# Patient Record
Sex: Female | Born: 1990 | Race: White | Hispanic: No | Smoking: Former smoker
Health system: Southern US, Academic
[De-identification: ages and names within clinical notes are randomized; demographics above are authoritative.]

## PROBLEM LIST (undated history)

## (undated) DIAGNOSIS — N390 Urinary tract infection, site not specified: Secondary | ICD-10-CM

## (undated) HISTORY — PX: CERVICAL BIOPSY: SHX590

## (undated) HISTORY — PX: HX VAGINAL WALL CYST ASPIRATION: 2100001351

---
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
## 2012-01-23 ENCOUNTER — Emergency Department (HOSPITAL_COMMUNITY)
Admission: EM | Admit: 2012-01-23 | Discharge: 2012-01-23 | Disposition: A | Discharge status: Home / Self Care | Payer: Medicaid Other | Attending: Emergency Medicine | Admitting: Emergency Medicine

## 2012-01-23 ENCOUNTER — Encounter (HOSPITAL_COMMUNITY): Payer: Self-pay | Admitting: *Deleted

## 2012-01-23 DIAGNOSIS — R102 Pelvic and perineal pain: Secondary | ICD-10-CM

## 2012-01-23 DIAGNOSIS — N949 Unspecified condition associated with female genital organs and menstrual cycle: Secondary | ICD-10-CM | POA: Insufficient documentation

## 2012-01-23 DIAGNOSIS — F17200 Nicotine dependence, unspecified, uncomplicated: Secondary | ICD-10-CM | POA: Insufficient documentation

## 2012-01-23 NOTE — ED Notes (Signed)
Any further testing is refused by patient at this time, edp aware

## 2012-01-23 NOTE — ED Notes (Signed)
Patient refuses in and out cath.  °

## 2012-01-23 NOTE — ED Provider Notes (Signed)
History  Scribed for Melissa Givens, MD, [name] Melissa [name] [name] was seen in room APA05/APA05. This chart was scribed by Melissa [name] Melissa [name] [name]. Melissa [name] Melissa [name] [name]'s care started at 1:31 PM    CSN: [id number]  Arrival date & time 01/23/12  1224   First MD Initiated Contact with Melissa [name] [name] 01/23/12 1303      Chief Complaint  Melissa [name] [name] presents with  . Abdominal Pain    Melissa [name] [name] is a 21 y.o. female presenting with abdominal pain. [name] history is provided by Melissa [name] Melissa [name] [name].  Abdominal Pain Melissa [name] primary symptoms of Melissa [name] illness include abdominal pain and nausea. Melissa [name] primary symptoms of Melissa [name] illness do not include vomiting, diarrhea or vaginal discharge. Melissa [name] current episode started 6 to 12 hours ago. Melissa [name] onset of Melissa [name] illness was sudden. Melissa [name] problem has been gradually improving.  Melissa [name] Melissa [name] [name] states that she believes she is currently not pregnant. Melissa [name] Melissa [name] [name] has not had a change in bowel habit. Symptoms associated with [name] illness do not include urgency or frequency.   Melissa [name] Melissa [name] [name] is a 21 y.o. female who presents [name] complaining of diffuse abdominal pain and cramping that started this morning around 7:00am and lasted several hours.  States it felt like she had knots in her abdomen. Pt states that she took motrin with some relief.  She denies vomiting, diarrhea, urgency, or discharge. She does have nausea.  Her LMP was 5/22 and states it was light and only lasted 3 days. Still has mild spotting.  She denies being on BCP's.   Pt here with husband and child, also patients  PCP none     History reviewed. No pertinent past medical history.  Past Surgical History  Procedure Date  . Cervical biopsy     No family history on file.  History  Substance Use Topics  . Smoking status: Current Some Day Smoker  . Smokeless tobacco: Not on file  . Alcohol Use: No  umemployed  OB History    Grav Para Term Preterm Abortions TAB SAB Ect Mult Living                  Review of Systems  Gastrointestinal:  Positive for nausea and abdominal pain. Negative for vomiting and diarrhea.  Genitourinary: Negative for urgency, frequency and vaginal discharge.  All other systems reviewed and are negative.    Allergies  Review of Melissa [name] [name]'s allergies indicates no known allergies.  Home Medications   Current Outpatient Rx  Name Route Sig Dispense Refill  . IBUPROFEN 200 MG PO TABS Oral Take 200 mg by mouth every 6 (six) hours as needed. For pain      BP 108/60  Pulse 82  Temp(Src) 98.7 F (37.1 C) (Oral)  Resp 20  Ht 4\' 11"  (1.499 m)  Wt 106 lb (48.081 kg)  BMI 21.41 kg/m2  SpO2 99%  LMP 01/20/2012  Vital signs normal    Physical Exam  Nursing note and vitals reviewed. Constitutional: She is oriented to person, place, and time. She appears well-developed and well-nourished.  Non-toxic appearance. She does not appear ill. No distress.  HENT:  Head: Normocephalic and atraumatic.  Right Ear: External ear normal.  Left Ear: External ear normal.  Nose: Nose normal. No mucosal edema or rhinorrhea.  Mouth/Throat: Oropharynx is clear and moist and mucous membranes are normal. No dental abscesses or uvula swelling.  Eyes: Conjunctivae and EOM are normal. Pupils are equal, round, and reactive to light.  Neck: Normal range of motion and full passive range of motion  without pain. Neck supple. No tracheal deviation present.  Cardiovascular: Normal rate, regular rhythm and normal heart sounds.  Exam reveals no gallop and no friction rub.   No murmur heard. Pulmonary/Chest: Effort normal and breath sounds normal. No respiratory distress. She has no wheezes. She has no rhonchi. She has no rales. She exhibits no tenderness and no crepitus.  Abdominal: Soft. Normal appearance and bowel sounds are normal. She exhibits no distension. There is Tenderness: suprapubic tenderness.. There is no rebound and no guarding.  Musculoskeletal: Normal range of motion. She exhibits no edema and no tenderness.        Moves all extremities well.   Neurological: She is alert and oriented to person, place, and time. She has normal strength. No cranial nerve deficit or sensory deficit.  Skin: Skin is warm, dry and intact. No rash noted. No erythema. No pallor.  Psychiatric: She has a normal mood and affect. Her speech is normal and behavior is normal. Her mood appears not anxious.    ED Course  Procedures  DIAGNOSTIC STUDIES: Oxygen Saturation is 99% on room air, normal by my interpretation.    COORDINATION OF CARE:  1:43PM Ordered: Urinalysis, Routine w reflex microscopic ; In and Out Cath ; Pelvic cart ; GC/chlamydia probe amp, genital ; Wet prep, genital ; Urinalysis, Routine w reflex microscopic  Pt refused cath urine, then refused pelvic exam, states she will come back if pain persists    Labs Reviewed  URINALYSIS, ROUTINE W REFLEX MICROSCOPIC  GC/CHLAMYDIA PROBE AMP, GENITAL  WET PREP, GENITAL  URINALYSIS, ROUTINE W REFLEX MICROSCOPIC   Labs not done, pt refused exam     1. Pelvic pain     Plan discharge  Devoria Albe, MD, FACEP   MDM   I personally performed [name] services described in this documentation, which was scribed in my presence. [name] recorded information has been reviewed and considered. Devoria Albe, MD, Armando Gang      Melissa Givens, MD 01/23/12 [phone]

## 2012-01-23 NOTE — Discharge Instructions (Signed)
Take the ibuprofen 600 mg 4 times a day for your discomfort. Recheck if your pain seems worse or if you are willing to be examined.

## 2012-01-23 NOTE — ED Notes (Signed)
No pain at present 

## 2012-01-23 NOTE — ED Notes (Signed)
Pt is refusing to have I&o cath want's to leave without any further treatment. [name]

## 2012-01-23 NOTE — ED Notes (Signed)
Generalized, intermittent, sharp pain to abdomen. Nausea at times. Pt states symptoms began this morning. NAD

## 2012-04-29 ENCOUNTER — Encounter (HOSPITAL_BASED_OUTPATIENT_CLINIC_OR_DEPARTMENT_OTHER): Payer: Self-pay | Admitting: *Deleted

## 2012-04-29 ENCOUNTER — Emergency Department (HOSPITAL_BASED_OUTPATIENT_CLINIC_OR_DEPARTMENT_OTHER)
Admission: EM | Admit: 2012-04-29 | Discharge: 2012-04-29 | Disposition: A | Discharge status: Home / Self Care | Payer: Medicaid Other | Attending: Emergency Medicine | Admitting: Emergency Medicine

## 2012-04-29 DIAGNOSIS — N760 Acute vaginitis: Secondary | ICD-10-CM | POA: Insufficient documentation

## 2012-04-29 DIAGNOSIS — A499 Bacterial infection, unspecified: Secondary | ICD-10-CM | POA: Insufficient documentation

## 2012-04-29 DIAGNOSIS — B9689 Other specified bacterial agents as the cause of diseases classified elsewhere: Secondary | ICD-10-CM | POA: Insufficient documentation

## 2012-04-29 DIAGNOSIS — N390 Urinary tract infection, site not specified: Secondary | ICD-10-CM | POA: Insufficient documentation

## 2012-04-29 LAB — URINALYSIS, ROUTINE W REFLEX MICROSCOPIC
Glucose, UA: NEGATIVE mg/dL
Ketones, ur: 15 mg/dL — AB
pH: 5.5 (ref 5.0–8.0)

## 2012-04-29 LAB — WET PREP, GENITAL: Yeast Wet Prep HPF POC: NONE SEEN

## 2012-04-29 LAB — URINE MICROSCOPIC-ADD ON

## 2012-04-29 MED ORDER — CEPHALEXIN 500 MG PO CAPS: 500.0000 mg | ORAL_CAPSULE | Freq: Four times a day (QID) | ORAL | Status: AC

## 2012-04-29 MED ORDER — METRONIDAZOLE 500 MG PO TABS: 500.0000 mg | ORAL_TABLET | Freq: Two times a day (BID) | ORAL | Status: AC
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
## 2012-04-29 NOTE — ED Notes (Signed)
Dysuria x 2 days. Chills.

## 2012-04-30 LAB — GC/CHLAMYDIA PROBE AMP, GENITAL: Chlamydia, DNA Probe: NEGATIVE

## 2012-05-01 LAB — URINE CULTURE
Colony Count: 35000
Special Requests: NORMAL

## 2013-09-13 ENCOUNTER — Encounter (HOSPITAL_BASED_OUTPATIENT_CLINIC_OR_DEPARTMENT_OTHER): Payer: Self-pay | Admitting: Emergency Medicine

## 2013-09-13 ENCOUNTER — Emergency Department (HOSPITAL_BASED_OUTPATIENT_CLINIC_OR_DEPARTMENT_OTHER)
Admission: EM | Admit: 2013-09-13 | Discharge: 2013-09-13 | Disposition: A | Discharge status: Home / Self Care | Payer: Medicaid Other | Attending: Emergency Medicine | Admitting: Emergency Medicine

## 2013-09-13 DIAGNOSIS — N939 Abnormal uterine and vaginal bleeding, unspecified: Secondary | ICD-10-CM

## 2013-09-13 DIAGNOSIS — F17200 Nicotine dependence, unspecified, uncomplicated: Secondary | ICD-10-CM | POA: Insufficient documentation

## 2013-09-13 DIAGNOSIS — N898 Other specified noninflammatory disorders of vagina: Secondary | ICD-10-CM | POA: Insufficient documentation

## 2013-09-13 DIAGNOSIS — Z3202 Encounter for pregnancy test, result negative: Secondary | ICD-10-CM | POA: Insufficient documentation

## 2013-09-13 LAB — URINALYSIS, ROUTINE W REFLEX MICROSCOPIC
Bilirubin Urine: NEGATIVE
Glucose, UA: NEGATIVE mg/dL
Hgb urine dipstick: NEGATIVE
Ketones, ur: NEGATIVE mg/dL
LEUKOCYTES UA: NEGATIVE
NITRITE: NEGATIVE
PH: 7 (ref 5.0–8.0)
Protein, ur: NEGATIVE mg/dL
SPECIFIC GRAVITY, URINE: 1.007 (ref 1.005–1.030)
UROBILINOGEN UA: 0.2 mg/dL (ref 0.0–1.0)

## 2013-09-13 LAB — WET PREP, GENITAL
Trich, Wet Prep: NONE SEEN
Yeast Wet Prep HPF POC: NONE SEEN

## 2013-09-13 LAB — PREGNANCY, URINE: PREG TEST UR: NEGATIVE

## 2013-09-13 NOTE — ED Notes (Signed)
Patient states that her LMP was 12/29 and the patient thinks she had a miscarriage last night. Patient has not had a positive pregnancy test, but last night she states she started bleeding and passed something that "was not a blood clot", but was appx 1/2 inch in size. States a friend told her that she "might have miscarried"

## 2013-09-13 NOTE — ED Provider Notes (Signed)
CSN: [id number]     Arrival date & time 09/13/13  1319 History   First MD Initiated Contact with Patient 09/13/13 1358     Chief Complaint  Patient presents with  . Vaginal Bleeding   (Consider location/radiation/quality/duration/timing/severity/associated sxs/prior Treatment) Patient is a 23 y.o. female presenting with vaginal bleeding. The history is provided by the patient. No language interpreter was used.  Vaginal Bleeding Quality:  Clots Severity:  Moderate Onset quality:  Gradual Timing:  Constant Progression:  Worsening Chronicity:  New Relieved by:  Nothing Worsened by:  Nothing tried Ineffective treatments:  None tried Pt complains of passing a clot last pm.   Pt worried that she had a miscarriage  History reviewed. No pertinent past medical history. Past Surgical History  Procedure Laterality Date  . Cervical biopsy     No family history on file. History  Substance Use Topics  . Smoking status: Current Some Day Smoker  . Smokeless tobacco: Not on file  . Alcohol Use: No   OB History   Grav Para Term Preterm Abortions TAB SAB Ect Mult Living                 Review of Systems  Genitourinary: Positive for vaginal bleeding.  All other systems reviewed and are negative.    Allergies  Review of patient's allergies indicates no known allergies.  Home Medications   Current Outpatient Rx  Name  Route  Sig  Dispense  Refill  . ibuprofen (ADVIL,MOTRIN) 200 MG tablet   Oral   Take 200 mg by mouth every 6 (six) hours as needed. For migraine.         . Pseudoephedrine-APAP-DM (DAYQUIL PO)   Oral   Take 30 mLs by mouth daily as needed. For sore throat.          BP 129/70  Pulse 81  Temp(Src) 98.6 F (37 C) (Oral)  Resp 18  Ht 4\' 11"  (1.499 m)  SpO2 100%  LMP 08/28/2013 Physical Exam  Constitutional: She is oriented to person, place, and time. She appears well-developed and well-nourished.  HENT:  Head: Normocephalic and atraumatic.  Eyes:  Conjunctivae are normal. Pupils are equal, round, and reactive to light.  Neck: Normal range of motion. Neck supple.  Cardiovascular: Normal rate.   Pulmonary/Chest: Effort normal.  Abdominal: Soft. There is no tenderness.  Genitourinary: Vaginal discharge found.  Vaginal discharge,  Thick white,  Adnexa no masses,  Cervix nontender  Musculoskeletal: Normal range of motion.  Neurological: She is alert and oriented to person, place, and time.  Skin: Skin is warm.    ED Course  Procedures (including critical care time) Labs Review Labs Reviewed  URINALYSIS, ROUTINE W REFLEX MICROSCOPIC  PREGNANCY, URINE   Imaging Review No results found.  EKG Interpretation   None       MDM   1. Vaginal bleeding    No bleeding.   [facility]     Elson AreasLeslie K Tyshawna Alarid, [place] JerseyPA-C 09/13/13 2111

## 2013-09-13 NOTE — Discharge Instructions (Signed)
Abnormal Uterine Bleeding Abnormal uterine bleeding means bleeding from the vagina that is not your normal menstrual period. This can be:  Bleeding or spotting between periods.  Bleeding after sex (sexual intercourse).  Bleeding that is heavier or more than normal.  Periods that last longer than usual.  Bleeding after menopause. There are many problems that may cause this. Treatment will depend on the cause of the bleeding. Any kind of bleeding that is not normal should be reviewed by your doctor.  HOME CARE Watch your condition for any changes. These actions may lessen any discomfort you are having:  Do not use tampons or douches as told by your doctor.  Change your pads often. You should get regular pelvic exams and Pap tests. Keep all appointments for tests as told by your doctor. GET HELP IF:  You are bleeding for more than 1 week.  You feel dizzy at times. GET HELP RIGHT AWAY IF:   You pass out.  You have to change pads every 15 to 30 minutes.  You have belly pain.  You have a fever.  You become sweaty or weak.  You are passing large blood clots from the vagina.  You feel sick to your stomach (nauseous) and throw up (vomit). MAKE SURE YOU:  Understand these instructions.  Will watch your condition.  Will get help right away if you are not doing well or get worse. Document Released: 06/14/2009 Document Revised: 06/07/2013 Document Reviewed: 03/16/2013 [employer] Patient Information 2014 [employer].  

## 2013-09-14 LAB — GC/CHLAMYDIA PROBE AMP
CT PROBE, AMP APTIMA: NEGATIVE
GC PROBE AMP APTIMA: NEGATIVE

## 2013-09-14 NOTE — ED Provider Notes (Signed)
Medical screening examination/treatment/procedure(s) were performed by non-physician practitioner and as supervising physician I was immediately available for consultation/collaboration.  EKG Interpretation   None         William Jaray Boliver, MD 09/14/13 2319 

## 2013-11-22 ENCOUNTER — Ambulatory Visit (INDEPENDENT_AMBULATORY_CARE_PROVIDER_SITE_OTHER): Payer: Medicaid Other | Admitting: Adult Health

## 2013-11-22 ENCOUNTER — Encounter: Payer: Self-pay | Admitting: Adult Health

## 2013-11-22 ENCOUNTER — Encounter (INDEPENDENT_AMBULATORY_CARE_PROVIDER_SITE_OTHER): Payer: Self-pay

## 2013-11-22 VITALS — BP 106/60 | Ht <= 58 in | Wt 109.0 lb

## 2013-11-22 DIAGNOSIS — Z3202 Encounter for pregnancy test, result negative: Secondary | ICD-10-CM

## 2013-11-22 LAB — POCT URINE PREGNANCY: Preg Test, Ur: NEGATIVE

## 2013-11-22 NOTE — Progress Notes (Signed)
Patient ID: Melissa [name], female   DOB: [birth year], 23 y.o.   MRN: [id number] Pt here for pregnancy test, resulted negative, LMP 09/28/2013. QHCG done today pt to call office back tomorrow afternoon for results.

## 2013-11-23 ENCOUNTER — Telehealth: Payer: Self-pay | Admitting: Adult Health

## 2013-11-23 ENCOUNTER — Telehealth: Payer: Self-pay | Admitting: *Deleted

## 2013-11-23 LAB — HCG, QUANTITATIVE, PREGNANCY: HCG, BETA CHAIN, QUANT, S: 117.3 m[IU]/mL

## 2013-11-23 NOTE — Telephone Encounter (Signed)
Left message that labs back, number [phone], 2-3 weeks ,call with questions

## 2013-11-24 ENCOUNTER — Other Ambulatory Visit: Payer: Medicaid Other

## 2013-11-24 DIAGNOSIS — Z3202 Encounter for pregnancy test, result negative: Secondary | ICD-10-CM

## 2013-11-24 MED ORDER — PRENATAL PLUS 27-1 MG PO TABS: ORAL_TABLET | ORAL | Status: AC

## 2013-11-24 NOTE — Addendum Note (Signed)
Addended by: Criss AlvinePULLIAM, Loriene Taunton G on: 11/24/2013 12:49 PM   Modules accepted: Orders

## 2013-11-25 ENCOUNTER — Telehealth: Payer: Self-pay | Admitting: Adult Health

## 2013-11-25 LAB — HCG, QUANTITATIVE, PREGNANCY: HCG, BETA CHAIN, QUANT, S: 289.3 m[IU]/mL

## 2013-11-25 NOTE — Telephone Encounter (Signed)
Called pt and left message that numbers more than doubled at [phone],if any questions can call office Monday.

## 2013-11-27 NOTE — Telephone Encounter (Signed)
Jennifer left message for pt

## 2013-11-28 ENCOUNTER — Telehealth: Payer: Self-pay | Admitting: Adult Health

## 2013-11-28 NOTE — Telephone Encounter (Signed)
Pt call transferred to front staff for a New OB appt. Pt has no complaints of cramping or bleeding at this time. Pt to start PNV and call our office back if any problems.

## 2013-12-11 ENCOUNTER — Telehealth: Payer: Self-pay | Admitting: Adult Health

## 2013-12-11 NOTE — Telephone Encounter (Signed)
Pt husband, Maverick, states pt not able to eat much for the past 4 days due to N/V, also pt has an history of HSV. Informed will do blood work if HSV + will be given abx closer to delivery. Will request provider to e-scribe something for N/V. Husband also states pt takes valtrex as a preventive for an HSV outbreak does she need to continue to take. Informed pt to continue to take Valtrex and discuss with provider at next appt.

## 2013-12-12 ENCOUNTER — Other Ambulatory Visit: Payer: Self-pay | Admitting: *Deleted

## 2013-12-12 MED ORDER — DOXYLAMINE-PYRIDOXINE 10-10 MG PO TBEC: 10.0000 mg | DELAYED_RELEASE_TABLET | ORAL | Status: DC

## 2013-12-26 ENCOUNTER — Other Ambulatory Visit: Payer: Self-pay | Admitting: Obstetrics & Gynecology

## 2013-12-26 ENCOUNTER — Other Ambulatory Visit: Payer: Medicaid Other

## 2013-12-26 DIAGNOSIS — O3680X0 Pregnancy with inconclusive fetal viability, not applicable or unspecified: Secondary | ICD-10-CM

## 2013-12-27 ENCOUNTER — Other Ambulatory Visit: Payer: Self-pay | Admitting: Obstetrics & Gynecology

## 2013-12-27 ENCOUNTER — Ambulatory Visit (INDEPENDENT_AMBULATORY_CARE_PROVIDER_SITE_OTHER): Payer: Medicaid Other

## 2013-12-27 DIAGNOSIS — O26849 Uterine size-date discrepancy, unspecified trimester: Secondary | ICD-10-CM

## 2013-12-27 DIAGNOSIS — O3680X0 Pregnancy with inconclusive fetal viability, not applicable or unspecified: Secondary | ICD-10-CM

## 2013-12-27 NOTE — Progress Notes (Signed)
U/S-single IUP with +FCA Noted FHR- 173 bpm, cx appears closed, bilateral adnexa appears wnl, CRL c/w 8+4wks EDD 08/04/2014

## 2014-01-09 ENCOUNTER — Encounter: Payer: Medicaid Other | Admitting: Adult Health

## 2014-01-24 ENCOUNTER — Encounter: Payer: Medicaid Other | Admitting: Women's Health

## 2014-01-31 ENCOUNTER — Ambulatory Visit (INDEPENDENT_AMBULATORY_CARE_PROVIDER_SITE_OTHER): Payer: Medicaid Other | Admitting: Women's Health

## 2014-01-31 ENCOUNTER — Encounter: Payer: Self-pay | Admitting: Women's Health

## 2014-01-31 ENCOUNTER — Other Ambulatory Visit (HOSPITAL_COMMUNITY)
Admission: RE | Admit: 2014-01-31 | Discharge: 2014-01-31 | Disposition: A | Discharge status: Home / Self Care | Payer: Medicaid Other | Source: Ambulatory Visit | Attending: Obstetrics and Gynecology | Admitting: Obstetrics and Gynecology

## 2014-01-31 VITALS — BP 110/60 | Ht 59.0 in | Wt 111.0 lb

## 2014-01-31 DIAGNOSIS — O2341 Unspecified infection of urinary tract in pregnancy, first trimester: Secondary | ICD-10-CM | POA: Insufficient documentation

## 2014-01-31 DIAGNOSIS — O219 Vomiting of pregnancy, unspecified: Secondary | ICD-10-CM

## 2014-01-31 DIAGNOSIS — Z01419 Encounter for gynecological examination (general) (routine) without abnormal findings: Secondary | ICD-10-CM | POA: Insufficient documentation

## 2014-01-31 DIAGNOSIS — O98519 Other viral diseases complicating pregnancy, unspecified trimester: Secondary | ICD-10-CM

## 2014-01-31 DIAGNOSIS — Z331 Pregnant state, incidental: Secondary | ICD-10-CM

## 2014-01-31 DIAGNOSIS — Z1389 Encounter for screening for other disorder: Secondary | ICD-10-CM

## 2014-01-31 DIAGNOSIS — O2390 Unspecified genitourinary tract infection in pregnancy, unspecified trimester: Secondary | ICD-10-CM

## 2014-01-31 DIAGNOSIS — Z3480 Encounter for supervision of other normal pregnancy, unspecified trimester: Secondary | ICD-10-CM

## 2014-01-31 DIAGNOSIS — O210 Mild hyperemesis gravidarum: Secondary | ICD-10-CM

## 2014-01-31 DIAGNOSIS — R768 Other specified abnormal immunological findings in serum: Secondary | ICD-10-CM

## 2014-01-31 LAB — CBC
HCT: 39.4 % (ref 36.0–46.0)
Hemoglobin: 13.3 g/dL (ref 12.0–15.0)
MCH: 31.3 pg (ref 26.0–34.0)
MCHC: 33.8 g/dL (ref 30.0–36.0)
MCV: 92.7 fL (ref 78.0–100.0)
PLATELETS: 234 10*3/uL (ref 150–400)
RBC: 4.25 MIL/uL (ref 3.87–5.11)
RDW: 13.2 % (ref 11.5–15.5)
WBC: 9.7 10*3/uL (ref 4.0–10.5)

## 2014-01-31 LAB — POCT URINALYSIS DIPSTICK
Blood, UA: NEGATIVE
GLUCOSE UA: NEGATIVE
Ketones, UA: NEGATIVE
NITRITE UA: POSITIVE
Protein, UA: NEGATIVE

## 2014-01-31 MED ORDER — ENSURE HEALTHY MOM PO LIQD: ORAL | Status: DC

## 2014-01-31 MED ORDER — NITROFURANTOIN MONOHYD MACRO 100 MG PO CAPS: 100.0000 mg | ORAL_CAPSULE | Freq: Two times a day (BID) | ORAL | Status: DC

## 2014-01-31 MED ORDER — PROMETHAZINE HCL 12.5 MG PO TABS: 12.5000 mg | ORAL_TABLET | Freq: Four times a day (QID) | ORAL | Status: AC | PRN

## 2014-01-31 NOTE — Progress Notes (Addendum)
  Subjective:  Melissa [name] is a 23 y.o. G59P1001 Caucasian female at [redacted]w[redacted]d by 8.4wk u/s, being seen today for her first obstetrical visit.  Her obstetrical history is significant for term uncomplicated svd, smoker 1ppd- quit w/ +PT.  Pregnancy history fully reviewed.  Patient reports n/v, tried diclegis and didn't help, only vomits few times a day now- but not able to keep anything down but cereal and liquids. States she was 90lb before pregnancy- claims she got up to 130lb- and has now lost most of that in the short 13wks she has been pregnant. Requests ensure rx to supplement since she is not able to eat well. Also requests different antiemetic. Denies vb, cramping, uti s/s, abnormal/malodorous vag d/c, or vulvovaginal itching/irritation.  Social History: Sexual: heterosexual Marital Status: married Living situation: with spouse Occupation: unemployed Tobacco/alcohol: quit smoking w/ +PT, was smoking 1ppd, no etoh Illicit drugs: no history of illicit drug use  BP 110/60  Wt 111 lb (50.349 kg)  LMP 08/28/2013  HISTORY: OB History  Gravida Para Term Preterm AB SAB TAB Ectopic Multiple Living  2 1 1       1     # Outcome Date GA Lbr Len/2nd Weight Sex Delivery Anes PTL Lv  2 CUR           1 TRM 09/02/10 [redacted]w[redacted]d  7 lb 10 oz (3.459 kg) M SVD EPI  Y     History reviewed. No pertinent past medical history. Past Surgical History  Procedure Laterality Date  . Cervical biopsy     Family History  Problem Relation Age of Onset  . Urinary tract infection Mother   . Allergies Father   . Cancer Paternal Grandmother   . Asthma Brother     Exam   System:     General: Well developed & nourished, no acute distress   Skin: Warm & dry, normal coloration and turgor, no rashes   Neurologic: Alert & oriented, normal mood   Cardiovascular: Regular rate & rhythm   Respiratory: Effort & rate normal, LCTAB, acyanotic   Abdomen: Soft, non tender   Extremities: normal strength, tone   Pelvic  Exam:    Perineum: Normal perineum   Vulva: Normal, no lesions   Vagina:  Normal mucosa, normal discharge   Cervix: Normal, bulbous, appears closed   Uterus: Normal size/shape/contour for GA   Thin prep pap smear obtained high risk HPV cotesting FHR: 163 via doppler   Assessment:   Pregnancy: G2P1001 Patient Active Problem List   Diagnosis Date Noted  . Supervision of other normal pregnancy 01/31/2014    Priority: High    [redacted]w[redacted]d G2P1001 New OB visit N/V of pregnancy Previous smoker- quit w/ +PT UTI   HSV2+  Plan:  Initial labs drawn Continue prenatal vitamins Problem list reviewed and updated Reviewed n/v relief measures and warning s/s to report Reviewed recommended weight gain based on pre-gravid BMI Encouraged well-balanced diet Genetic Screening discussed Quad Screen: requested Cystic fibrosis screening discussed requested Ultrasound discussed; fetal survey: requested Follow up in 4 weeks for visit and AFP CCNC completed Rx phenergan 12.5mg  q6hr prn n/v, if ever unable to keep food/fluids down- go to hospital Rx macrobid bid x 7d for uti Rx ensure as needed for supplemental calories/nutirition per pt request  Marge Duncans CNM, WHNP-BC 01/31/2014 11:48 AM

## 2014-01-31 NOTE — Patient Instructions (Signed)
Second Trimester of Pregnancy The second trimester is from week 13 through week 28, months 4 through 6. The second trimester is often a time when you feel your best. Your body has also adjusted to being pregnant, and you begin to feel better physically. Usually, morning sickness has lessened or quit completely, you may have more energy, and you may have an increase in appetite. The second trimester is also a time when the fetus is growing rapidly. At the end of the sixth month, the fetus is about 9 inches long and weighs about 1 pounds. You will likely begin to feel the baby move (quickening) between 18 and 20 weeks of the pregnancy. BODY CHANGES Your body goes through many changes during pregnancy. The changes vary from woman to woman.   Your weight will continue to increase. You will notice your lower abdomen bulging out.  You may begin to get stretch marks on your hips, abdomen, and breasts.  You may develop headaches that can be relieved by medicines approved by your caregiver.  You may urinate more often because the fetus is pressing on your bladder.  You may develop or continue to have heartburn as a result of your pregnancy.  You may develop constipation because certain hormones are causing the muscles that push waste through your intestines to slow down.  You may develop hemorrhoids or swollen, bulging veins (varicose veins).  You may have back pain because of the weight gain and pregnancy hormones relaxing your joints between the bones in your pelvis and as a result of a shift in weight and the muscles that support your balance.  Your breasts will continue to grow and be tender.  Your gums may bleed and may be sensitive to brushing and flossing.  Dark spots or blotches (chloasma, mask of pregnancy) may develop on your face. This will likely fade after the baby is born.  A dark line from your belly button to the pubic area (linea nigra) may appear. This will likely fade after the  baby is born. WHAT TO EXPECT AT YOUR PRENATAL VISITS During a routine prenatal visit:  You will be weighed to make sure you and the fetus are growing normally.  Your blood pressure will be taken.  Your abdomen will be measured to track your baby's growth.  The fetal heartbeat will be listened to.  Any test results from the previous visit will be discussed. Your caregiver may ask you:  How you are feeling.  If you are feeling the baby move.  If you have had any abnormal symptoms, such as leaking fluid, bleeding, severe headaches, or abdominal cramping.  If you have any questions. Other tests that may be performed during your second trimester include:  Blood tests that check for:  Low iron levels (anemia).  Gestational diabetes (between 24 and 28 weeks).  Rh antibodies.  Urine tests to check for infections, diabetes, or protein in the urine.  An ultrasound to confirm the proper growth and development of the baby.  An amniocentesis to check for possible genetic problems.  Fetal screens for spina bifida and Down syndrome. HOME CARE INSTRUCTIONS   Avoid all smoking, herbs, alcohol, and unprescribed drugs. These chemicals affect the formation and growth of the baby.  Follow your caregiver's instructions regarding medicine use. There are medicines that are either safe or unsafe to take during pregnancy.  Exercise only as directed by your caregiver. Experiencing uterine cramps is a good sign to stop exercising.  Continue to eat regular,   healthy meals.  Wear a good support bra for breast tenderness.  Do not use hot tubs, steam rooms, or saunas.  Wear your seat belt at all times when driving.  Avoid raw meat, uncooked cheese, cat litter boxes, and soil used by cats. These carry germs that can cause birth defects in the baby.  Take your prenatal vitamins.  Try taking a stool softener (if your caregiver approves) if you develop constipation. Eat more high-fiber foods,  such as fresh vegetables or fruit and whole grains. Drink plenty of fluids to keep your urine clear or pale yellow.  Take warm sitz baths to soothe any pain or discomfort caused by hemorrhoids. Use hemorrhoid cream if your caregiver approves.  If you develop varicose veins, wear support hose. Elevate your feet for 15 minutes, 3 4 times a day. Limit salt in your diet.  Avoid heavy lifting, wear low heel shoes, and practice good posture.  Rest with your legs elevated if you have leg cramps or low back pain.  Visit your dentist if you have not gone yet during your pregnancy. Use a soft toothbrush to brush your teeth and be gentle when you floss.  A sexual relationship may be continued unless your caregiver directs you otherwise.  Continue to go to all your prenatal visits as directed by your caregiver. SEEK MEDICAL CARE IF:   You have dizziness.  You have mild pelvic cramps, pelvic pressure, or nagging pain in the abdominal area.  You have persistent nausea, vomiting, or diarrhea.  You have a bad smelling vaginal discharge.  You have pain with urination. SEEK IMMEDIATE MEDICAL CARE IF:   You have a fever.  You are leaking fluid from your vagina.  You have spotting or bleeding from your vagina.  You have severe abdominal cramping or pain.  You have rapid weight gain or loss.  You have shortness of breath with chest pain.  You notice sudden or extreme swelling of your face, hands, ankles, feet, or legs.  You have not felt your baby move in over an hour.  You have severe headaches that do not go away with medicine.  You have vision changes. Document Released: 08/11/2001 Document Revised: 04/19/2013 Document Reviewed: 10/18/2012 [facility] Patient Information 2014 [place], [place].  Pregnancy and Urinary Tract Infection A urinary tract infection (UTI) is a bacterial infection of the urinary tract. Infection of the urinary tract can include the ureters, kidneys  (pyelonephritis), bladder (cystitis), and urethra (urethritis). All pregnant women should be screened for bacteria in the urinary tract. Identifying and treating a UTI will decrease the risk of preterm labor and developing more serious infections in both the mother and baby. CAUSES Bacteria germs cause almost all UTIs.  RISK FACTORS Many factors can increase your chances of getting a UTI during pregnancy. These include:  Having a short urethra.  Poor toilet and hygiene habits.  Sexual intercourse.  Blockage of urine along the urinary tract.  Problems with the pelvic muscles or nerves.  Diabetes.  Obesity.  Bladder problems after having several children.  Previous history of UTI. SIGNS AND SYMPTOMS   Pain, burning, or a stinging feeling when urinating.  Suddenly feeling the need to urinate right away (urgency).  Loss of bladder control (urinary incontinence).  Frequent urination, more than is common with pregnancy.  Lower abdominal or back discomfort.  Cloudy urine.  Blood in the urine (hematuria).  Fever. When the kidneys are infected, the symptoms may be:  Back pain.  Flank pain on the  right side more so than the left.  Fever.  Chills.  Nausea.  Vomiting. DIAGNOSIS  A urinary tract infection is usually diagnosed through urine tests. Additional tests and procedures are sometimes done. These may include:  Ultrasound exam of the kidneys, ureters, bladder, and urethra.  Looking in the bladder with a lighted tube (cystoscopy). TREATMENT Typically, UTIs can be treated with antibiotic medicines.  HOME CARE INSTRUCTIONS   Only take over-the-counter or prescription medicines as directed by your health care provider. If you were prescribed antibiotics, take them as directed. Finish them even if you start to feel better.  Drink enough fluids to keep your urine clear or pale yellow.  Do not have sexual intercourse until the infection is gone and your health  care provider says it is okay.  Make sure you are tested for UTIs throughout your pregnancy. These infections often come back. Preventing a UTI in the Future  Practice good toilet habits. Always wipe from front to back. Use the tissue only once.  Do not hold your urine. Empty your bladder as soon as possible when the urge comes.  Do not douche or use deodorant sprays.  Wash with soap and warm water around the genital area and the anus.  Empty your bladder before and after sexual intercourse.  Wear underwear with a cotton crotch.  Avoid caffeine and carbonated drinks. They can irritate the bladder.  Drink cranberry juice or take cranberry pills. This may decrease the risk of getting a UTI.  Do not drink alcohol.  Keep all your appointments and tests as scheduled. SEEK MEDICAL CARE IF:   Your symptoms get worse.  You are still having fevers 2 or more days after treatment begins.  You have a rash.  You feel that you are having problems with medicines prescribed.  You have abnormal vaginal discharge. SEEK IMMEDIATE MEDICAL CARE IF:   You have back or flank pain.  You have chills.  You have blood in your urine.  You have nausea and vomiting.  You have contractions of your uterus.  You have a gush of fluid from the vagina. MAKE SURE YOU:  Understand these instructions.   Will watch your condition.   Will get help right away if you are not doing well or get worse.  Document Released: 12/12/2010 Document Revised: 06/07/2013 Document Reviewed: 03/16/2013 [facility] Patient Information 2014 [place], [place].

## 2014-01-31 NOTE — Addendum Note (Signed)
Addended by: Colen Darling on: 01/31/2014 02:12 PM   Modules accepted: Orders

## 2014-02-01 LAB — CYSTIC FIBROSIS DIAGNOSTIC STUDY

## 2014-02-01 LAB — DRUG SCREEN, URINE, NO CONFIRMATION
AMPHETAMINE SCRN UR: NEGATIVE
BARBITURATE QUANT UR: NEGATIVE
Benzodiazepines.: NEGATIVE
Cocaine Metabolites: NEGATIVE
Creatinine,U: 189.1 mg/dL
METHADONE: NEGATIVE
Marijuana Metabolite: POSITIVE — AB
Opiate Screen, Urine: NEGATIVE
Phencyclidine (PCP): NEGATIVE
Propoxyphene: NEGATIVE

## 2014-02-01 LAB — URINALYSIS
BILIRUBIN URINE: NEGATIVE
Glucose, UA: NEGATIVE mg/dL
Hgb urine dipstick: NEGATIVE
Ketones, ur: NEGATIVE mg/dL
Nitrite: POSITIVE — AB
PROTEIN: NEGATIVE mg/dL
Specific Gravity, Urine: 1.02 (ref 1.005–1.030)
Urobilinogen, UA: 0.2 mg/dL (ref 0.0–1.0)
pH: 6 (ref 5.0–8.0)

## 2014-02-01 LAB — OXYCODONE SCREEN, UA, RFLX CONFIRM: OXYCODONE SCRN UR: NEGATIVE ng/mL

## 2014-02-01 LAB — GC/CHLAMYDIA PROBE AMP
CT Probe RNA: NEGATIVE
GC PROBE AMP APTIMA: NEGATIVE

## 2014-02-01 LAB — RPR

## 2014-02-01 LAB — HIV ANTIBODY (ROUTINE TESTING W REFLEX): HIV: NONREACTIVE

## 2014-02-01 LAB — VARICELLA ZOSTER ANTIBODY, IGG: Varicella IgG: 531.8 Index — ABNORMAL HIGH (ref <135.00)

## 2014-02-01 LAB — ANTIBODY SCREEN: ANTIBODY SCREEN: NEGATIVE

## 2014-02-01 LAB — RUBELLA SCREEN: RUBELLA: 7.55 {index} — AB (ref <0.90)

## 2014-02-01 LAB — ABO AND RH: Rh Type: POSITIVE

## 2014-02-01 LAB — HEPATITIS B SURFACE ANTIGEN: Hepatitis B Surface Ag: NEGATIVE

## 2014-02-03 LAB — URINE CULTURE: Colony Count: >=100000

## 2014-02-05 ENCOUNTER — Encounter: Payer: Self-pay | Admitting: Women's Health

## 2014-02-05 DIAGNOSIS — F1290 Cannabis use, unspecified, uncomplicated: Secondary | ICD-10-CM | POA: Insufficient documentation

## 2014-02-05 LAB — CYTOLOGY - PAP

## 2014-02-07 ENCOUNTER — Encounter: Payer: Self-pay | Admitting: Women's Health

## 2014-02-16 ENCOUNTER — Telehealth: Payer: Self-pay | Admitting: Obstetrics and Gynecology

## 2014-02-16 NOTE — Telephone Encounter (Signed)
Pt states saw on Mychart  urine culture was positive for Ecoli. Per Joellyn HaffKim Booker, CNM note, pt will get RX for Macrobid at next appt. Pt verbalized understanding.

## 2014-02-28 ENCOUNTER — Encounter: Payer: Medicaid Other | Admitting: Adult Health

## 2014-02-28 ENCOUNTER — Other Ambulatory Visit: Payer: Medicaid Other

## 2014-03-01 ENCOUNTER — Other Ambulatory Visit: Payer: Self-pay | Admitting: Obstetrics and Gynecology

## 2014-03-01 DIAGNOSIS — F1920 Other psychoactive substance dependence, uncomplicated: Secondary | ICD-10-CM

## 2014-03-01 DIAGNOSIS — O99320 Drug use complicating pregnancy, unspecified trimester: Principal | ICD-10-CM

## 2014-03-06 ENCOUNTER — Ambulatory Visit (INDEPENDENT_AMBULATORY_CARE_PROVIDER_SITE_OTHER): Payer: Medicaid Other | Admitting: Advanced Practice Midwife

## 2014-03-06 ENCOUNTER — Encounter: Payer: Self-pay | Admitting: Advanced Practice Midwife

## 2014-03-06 ENCOUNTER — Ambulatory Visit (INDEPENDENT_AMBULATORY_CARE_PROVIDER_SITE_OTHER): Payer: Medicaid Other

## 2014-03-06 VITALS — BP 100/52 | Wt 116.0 lb

## 2014-03-06 DIAGNOSIS — Z331 Pregnant state, incidental: Secondary | ICD-10-CM

## 2014-03-06 DIAGNOSIS — R8290 Unspecified abnormal findings in urine: Secondary | ICD-10-CM

## 2014-03-06 DIAGNOSIS — O2341 Unspecified infection of urinary tract in pregnancy, first trimester: Secondary | ICD-10-CM

## 2014-03-06 DIAGNOSIS — O99320 Drug use complicating pregnancy, unspecified trimester: Principal | ICD-10-CM

## 2014-03-06 DIAGNOSIS — F1920 Other psychoactive substance dependence, uncomplicated: Secondary | ICD-10-CM

## 2014-03-06 DIAGNOSIS — Z3482 Encounter for supervision of other normal pregnancy, second trimester: Secondary | ICD-10-CM

## 2014-03-06 DIAGNOSIS — Z1389 Encounter for screening for other disorder: Secondary | ICD-10-CM

## 2014-03-06 DIAGNOSIS — Z3480 Encounter for supervision of other normal pregnancy, unspecified trimester: Secondary | ICD-10-CM

## 2014-03-06 LAB — POCT URINALYSIS DIPSTICK
Blood, UA: NEGATIVE
GLUCOSE UA: NEGATIVE
Ketones, UA: NEGATIVE
Protein, UA: NEGATIVE

## 2014-03-06 NOTE — Progress Notes (Signed)
[id number] [id number] Estimated Date of Delivery: 08/04/14  Blood pressure 100/52, weight 116 lb (52.617 kg), last menstrual period 08/28/2013.   BP weight and urine results all reviewed and noted.  Please refer to the obstetrical flow sheet for the fundal height and fetal heart rate documentation:  Had anatomy scan today, all normal.  Patient reports good fetal movement, denies any bleeding and no rupture of membranes symptoms or regular contractions. Patient is without complaints. All questions were answered.  Plan:  Continued routine obstetrical care, culture urine (POC UTI).  AFP today Follow up in 4 weeks for OB appointment,

## 2014-03-06 NOTE — Progress Notes (Signed)
U/S(18+3wks)-active fetus, meas c/w dates, fluid wnl, anterior Gr 0 placenta, cx appears closed (3.6cm), bilateral adnexa appears WNL, FHR-147 bpm, female fetus, no major abnl noted

## 2014-03-07 LAB — AFP, QUAD SCREEN
AFP: 51.7 IU/mL
Age Alone: 1:1140 {titer}
Curr Gest Age: 18.3 wks.days
Down Syndrome Scr Risk Est: 1:9200 {titer}
HCG TOTAL: 18368 m[IU]/mL
INH: 342.1 pg/mL
INTERPRETATION-AFP: NEGATIVE
MOM FOR AFP: 1.08
MOM FOR HCG: 0.92
MoM for INH: 1.47
OPEN SPINA BIFIDA: NEGATIVE
Tri 18 Scr Risk Est: NEGATIVE
UE3 MOM: 0.93
UE3 VALUE: 0.9 ng/mL

## 2014-03-09 LAB — URINE CULTURE

## 2014-03-13 ENCOUNTER — Encounter: Payer: Self-pay | Admitting: Advanced Practice Midwife

## 2014-03-13 MED ORDER — AMOXICILLIN-POT CLAVULANATE 500-125 MG PO TABS: 1.0000 | ORAL_TABLET | Freq: Two times a day (BID) | ORAL | Status: DC

## 2014-03-13 NOTE — Addendum Note (Signed)
Addended by: Jacklyn ShellRESENZO-DISHMON, Karmela Bram on: 03/13/2014 12:02 PM   Modules accepted: Orders

## 2014-04-03 ENCOUNTER — Encounter: Payer: Medicaid Other | Admitting: Advanced Practice Midwife

## 2014-06-15 ENCOUNTER — Other Ambulatory Visit: Payer: Self-pay

## 2014-07-02 ENCOUNTER — Encounter: Payer: Self-pay | Admitting: Advanced Practice Midwife

## 2014-09-16 ENCOUNTER — Encounter (HOSPITAL_BASED_OUTPATIENT_CLINIC_OR_DEPARTMENT_OTHER): Payer: Self-pay | Admitting: *Deleted

## 2014-09-16 ENCOUNTER — Emergency Department (HOSPITAL_BASED_OUTPATIENT_CLINIC_OR_DEPARTMENT_OTHER)
Admission: EM | Admit: 2014-09-16 | Discharge: 2014-09-16 | Disposition: A | Discharge status: Home / Self Care | Payer: Medicaid Other | Attending: Emergency Medicine | Admitting: Emergency Medicine

## 2014-09-16 DIAGNOSIS — Z720 Tobacco use: Secondary | ICD-10-CM | POA: Insufficient documentation

## 2014-09-16 DIAGNOSIS — Z792 Long term (current) use of antibiotics: Secondary | ICD-10-CM | POA: Diagnosis not present

## 2014-09-16 DIAGNOSIS — Z79899 Other long term (current) drug therapy: Secondary | ICD-10-CM | POA: Diagnosis not present

## 2014-09-16 DIAGNOSIS — H02846 Edema of left eye, unspecified eyelid: Secondary | ICD-10-CM | POA: Insufficient documentation

## 2014-09-16 DIAGNOSIS — H5713 Ocular pain, bilateral: Secondary | ICD-10-CM | POA: Diagnosis present

## 2014-09-16 DIAGNOSIS — H02843 Edema of right eye, unspecified eyelid: Secondary | ICD-10-CM | POA: Diagnosis not present

## 2014-09-16 NOTE — ED Notes (Signed)
Periorbital eye swelling x 1 day

## 2014-09-16 NOTE — Discharge Instructions (Signed)
Take Benadryl every 6 hours as needed for itching. Apply cool compresses to your eyes, intermittently with warm. Allergies Allergies may happen from anything your body is sensitive to. This may be food, medicines, pollens, chemicals, and nearly anything around you in everyday life that produces allergens. An allergen is anything that causes an allergy producing substance. Heredity is often a factor in causing these problems. This means you may have some of the same allergies as your parents. Food allergies happen in all age groups. Food allergies are some of the most severe and life threatening. Some common food allergies are cow's milk, seafood, eggs, nuts, wheat, and soybeans. SYMPTOMS   Swelling around the mouth.  An itchy red rash or hives.  Vomiting or diarrhea.  Difficulty breathing. SEVERE ALLERGIC REACTIONS ARE LIFE-THREATENING. This reaction is called anaphylaxis. It can cause the mouth and throat to swell and cause difficulty with breathing and swallowing. In severe reactions only a trace amount of food (for example, peanut oil in a salad) may cause death within seconds. Seasonal allergies occur in all age groups. These are seasonal because they usually occur during the same season every year. They may be a reaction to molds, grass pollens, or tree pollens. Other causes of problems are house dust mite allergens, pet dander, and mold spores. The symptoms often consist of nasal congestion, a runny itchy nose associated with sneezing, and tearing itchy eyes. There is often an associated itching of the mouth and ears. The problems happen when you come in contact with pollens and other allergens. Allergens are the particles in the air that the body reacts to with an allergic reaction. This causes you to release allergic antibodies. Through a chain of events, these eventually cause you to release histamine into the blood stream. Although it is meant to be protective to the body, it is this release  that causes your discomfort. This is why you were given anti-histamines to feel better. If you are unable to pinpoint the offending allergen, it may be determined by skin or blood testing. Allergies cannot be cured but can be controlled with medicine. Hay fever is a collection of all or some of the seasonal allergy problems. It may often be treated with simple over-the-counter medicine such as diphenhydramine. Take medicine as directed. Do not drink alcohol or drive while taking this medicine. Check with your caregiver or package insert for child dosages. If these medicines are not effective, there are many new medicines your caregiver can prescribe. Stronger medicine such as nasal spray, eye drops, and corticosteroids may be used if the first things you try do not work well. Other treatments such as immunotherapy or desensitizing injections can be used if all else fails. Follow up with your caregiver if problems continue. These seasonal allergies are usually not life threatening. They are generally more of a nuisance that can often be handled using medicine. HOME CARE INSTRUCTIONS   If unsure what causes a reaction, keep a diary of foods eaten and symptoms that follow. Avoid foods that cause reactions.  If hives or rash are present:  Take medicine as directed.  You may use an over-the-counter antihistamine (diphenhydramine) for hives and itching as needed.  Apply cold compresses (cloths) to the skin or take baths in cool water. Avoid hot baths or showers. Heat will make a rash and itching worse.  If you are severely allergic:  Following a treatment for a severe reaction, hospitalization is often required for closer follow-up.  Wear a medic-alert bracelet  or necklace stating the allergy.  You and your family must learn how to give adrenaline or use an anaphylaxis kit.  If you have had a severe reaction, always carry your anaphylaxis kit or EpiPen with you. Use this medicine as directed by  your caregiver if a severe reaction is occurring. Failure to do so could have a fatal outcome. SEEK MEDICAL CARE IF:  You suspect a food allergy. Symptoms generally happen within 30 minutes of eating a food.  Your symptoms have not gone away within 2 days or are getting worse.  You develop new symptoms.  You want to retest yourself or your child with a food or drink you think causes an allergic reaction. Never do this if an anaphylactic reaction to that food or drink has happened before. Only do this under the care of a caregiver. SEEK IMMEDIATE MEDICAL CARE IF:   You have difficulty breathing, are wheezing, or have a tight feeling in your chest or throat.  You have a swollen mouth, or you have hives, swelling, or itching all over your body.  You have had a severe reaction that has responded to your anaphylaxis kit or an EpiPen. These reactions may return when the medicine has worn off. These reactions should be considered life threatening. MAKE SURE YOU:   Understand these instructions.  Will watch your condition.  Will get help right away if you are not doing well or get worse. Document Released: 11/10/2002 Document Revised: 12/12/2012 Document Reviewed: 04/16/2008 [facility] Patient Information 2015 [place], [place]. This information is not intended to replace advice given to you by your health care provider. Make sure you discuss any questions you have with your health care provider.

## 2014-09-16 NOTE — ED Provider Notes (Signed)
CSN: [id number]     Arrival date & time 09/16/14  1427 History   First MD Initiated Contact with Patient 09/16/14 1429     Chief Complaint  Patient presents with  . Eye Pain     (Consider location/radiation/quality/duration/timing/severity/associated sxs/prior Treatment) HPI Comments: 24 y/o female presenting with eyelid swelling x 2 days. Pt reports 2 nights ago her left eyelid started to swell and become itchy, and today her right eyelid started swelling. Denies eye pain or blurred vision, just states the swelling is blocking part of her eye. Denies fevers. No new soaps, detergents, lotions or makeup. States symptoms began when she went to a family members house where she needed to stay the night and it was very dirty. She has not tried any alleviating factors for her symptoms.  Patient is a 24 y.o. female presenting with eye pain. The history is provided by the patient.  Eye Pain    History reviewed. No pertinent past medical history. Past Surgical History  Procedure Laterality Date  . Cervical biopsy     Family History  Problem Relation Age of Onset  . Urinary tract infection Mother   . Allergies Father   . Cancer Paternal Grandmother   . Asthma Brother    History  Substance Use Topics  . Smoking status: Current Every Day Smoker    Types: Cigarettes    Last Attempt to Quit: 11/19/2013  . Smokeless tobacco: Never Used  . Alcohol Use: No   OB History    Gravida Para Term Preterm AB TAB SAB Ectopic Multiple Living   2 1 1       1      Review of Systems  10 Systems reviewed and are negative for acute change except as noted in the HPI.  Allergies  Review of patient's allergies indicates no known allergies.  Home Medications   Prior to Admission medications   Medication Sig Start Date End Date Taking? Authorizing Provider  amoxicillin-clavulanate (AUGMENTIN) 500-125 MG per tablet Take 1 tablet (500 mg total) by mouth 2 (two) times daily. 03/13/14   Jacklyn ShellFrances  Cresenzo-Dishmon, CNM  Doxylamine-Pyridoxine (DICLEGIS) 10-10 MG TBEC Take 10 mg by mouth See admin instructions. 12/12/13   Marge DuncansKimberly Randall Booker, CNM  Nutritional Supplements (ENSURE HEALTHY MOM) LIQD 1-3 cans daily if not able to keep a lot of foods down 01/31/14   Marge DuncansKimberly Randall Booker, CNM  prenatal vitamin w/FE, FA (PRENATAL 1 + 1) 27-1 MG TABS tablet 1 tablet daily 11/24/13   Adline PotterJennifer A Griffin, NP  promethazine (PHENERGAN) 12.5 MG tablet Take 1 tablet (12.5 mg total) by mouth every 6 (six) hours as needed for nausea or vomiting. 01/31/14   Marge DuncansKimberly Randall Booker, CNM   BP 119/60 mmHg  Pulse 78  Temp(Src) 97.8 F (36.6 C) (Oral)  Resp 16  SpO2 95%  LMP 09/02/2014  Breastfeeding? No Physical Exam  Constitutional: She is oriented to person, place, and time. She appears well-developed and well-nourished. No distress.  HENT:  Head: Normocephalic and atraumatic.  Mouth/Throat: Oropharynx is clear and moist.  Eyes: Conjunctivae and EOM are normal. Pupils are equal, round, and reactive to light. Right eye exhibits no discharge. Left eye exhibits no discharge.  Bilateral eyelid swelling, R>L. No erythema or warmth. No hordeolum. No FB.  Neck: Normal range of motion. Neck supple.  Cardiovascular: Normal rate, regular rhythm and normal heart sounds.   Pulmonary/Chest: Effort normal and breath sounds normal. No respiratory distress.  Musculoskeletal: Normal range of motion. She exhibits  no edema.  Neurological: She is alert and oriented to person, place, and time. No sensory deficit.  Skin: Skin is warm and dry.  Psychiatric: She has a normal mood and affect. Her behavior is normal.  Nursing note and vitals reviewed.   ED Course  Procedures (including critical care time) Labs Review Labs Reviewed - No data to display  Imaging Review No results found.   EKG Interpretation None      MDM   Final diagnoses:  Eyelid edema, right  Eyelid edema, left   Pt in NAD. AFVSS. No  erythema or warmth concerning for cellulitis. Most likely allergic reaction to the house she went to. No eye involvement. Advised cool compresses and benadryl. She is no longer going to stay at that home. Stable for d/c. Return precautions given. Patient states understanding of treatment care plan and is agreeable.  Kathrynn Speed, PA-C 09/16/14 1507  Linwood Dibbles, MD 09/19/14 [phone]

## 2016-10-31 ENCOUNTER — Encounter (HOSPITAL_BASED_OUTPATIENT_CLINIC_OR_DEPARTMENT_OTHER): Payer: Self-pay | Admitting: Emergency Medicine

## 2016-10-31 ENCOUNTER — Emergency Department (HOSPITAL_BASED_OUTPATIENT_CLINIC_OR_DEPARTMENT_OTHER)
Admission: EM | Admit: 2016-10-31 | Discharge: 2016-11-01 | Disposition: A | Discharge status: Home / Self Care | Payer: Medicaid Other | Attending: Emergency Medicine | Admitting: Emergency Medicine

## 2016-10-31 DIAGNOSIS — F17210 Nicotine dependence, cigarettes, uncomplicated: Secondary | ICD-10-CM | POA: Insufficient documentation

## 2016-10-31 DIAGNOSIS — N764 Abscess of vulva: Secondary | ICD-10-CM | POA: Diagnosis not present

## 2016-10-31 DIAGNOSIS — N760 Acute vaginitis: Secondary | ICD-10-CM

## 2016-10-31 LAB — URINALYSIS, ROUTINE W REFLEX MICROSCOPIC
BILIRUBIN URINE: NEGATIVE
GLUCOSE, UA: NEGATIVE mg/dL
KETONES UR: NEGATIVE mg/dL
NITRITE: POSITIVE — AB
Protein, ur: NEGATIVE mg/dL
Specific Gravity, Urine: 1.013 (ref 1.005–1.030)
pH: 5.5 (ref 5.0–8.0)

## 2016-10-31 LAB — PREGNANCY, URINE: Preg Test, Ur: NEGATIVE

## 2016-10-31 LAB — URINALYSIS, MICROSCOPIC (REFLEX)

## 2016-10-31 NOTE — ED Provider Notes (Signed)
[facility] Note   CSN: [id number] Arrival date & time: 10/31/16  2239   By signing my name below, I, [name], attest that this documentation has been prepared under the direction and in the presence of [name], MD  Electronically Signed: [name], ED Scribe. 10/31/16. 12:07 AM.   History   Chief Complaint Chief Complaint  Patient presents with  . Groin Swelling   The history is provided by the patient. No language interpreter was used.   HPI Comments:  Melissa [name] is a 26 y.o. female who presents to the Emergency Department complaining of acute onset, gradually worsening, moderate clitoral pain and associated swelling onset several weeks. She also reports discoloration to the area. Her pain is worse when sitting. Pt states she had a vaginal delivery which ripped her clitoris in 03/2016 and notes she had stiches to the area. No alleviating factors noted. Pt denies drainage from the area, vaginal discharge, difficulty urinating, bowel issues, abdominal pain, any recent sexual activity or any other associated symptoms.   History reviewed. No pertinent past medical history.  Patient Active Problem List   Diagnosis Date Noted  . 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 Supervision of other normal pregnancy 01/31/2014  . 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 UTI (urinary tract infection) in pregnancy in first trimester 01/31/2014    Past Surgical History:  Procedure Laterality Date  . CERVICAL BIOPSY      OB History    Gravida Para Term Preterm AB Living   2 1 1     1    SAB TAB Ectopic Multiple Live Births           1       Home Medications    Prior to Admission medications   Medication Sig Start Date End Date Taking? Authorizing Provider  amoxicillin-clavulanate (AUGMENTIN) 500-125 MG per tablet Take 1 tablet (500 mg total) by mouth 2 (two) times daily. 03/13/14   Jacklyn Shell, CNM  Doxylamine-Pyridoxine (DICLEGIS) 10-10 MG TBEC Take 10 mg  by mouth See admin instructions. 12/12/13   Cheral Marker, CNM  Nutritional Supplements (ENSURE HEALTHY MOM) LIQD 1-3 cans daily if not able to keep a lot of foods down 01/31/14   Cheral Marker, CNM  prenatal vitamin w/FE, FA (PRENATAL 1 + 1) 27-1 MG TABS tablet 1 tablet daily 11/24/13   Adline Potter, NP  promethazine (PHENERGAN) 12.5 MG tablet Take 1 tablet (12.5 mg total) by mouth every 6 (six) hours as needed for nausea or vomiting. 01/31/14   Cheral Marker, CNM    Family History Family History  Problem Relation Age of Onset  . Urinary tract infection Mother   . Allergies Father   . Cancer Paternal Grandmother   . Asthma Brother     Social History Social History  Substance Use Topics  . Smoking status: Current Every Day Smoker    Types: Cigarettes    Last attempt to quit: 11/19/2013  . Smokeless tobacco: Never Used  . Alcohol use No     Allergies   Keflex [cephalexin]   Review of Systems Review of Systems  Gastrointestinal: Negative for abdominal pain.  Genitourinary: Negative for difficulty urinating and vaginal discharge.  All other systems reviewed and are negative.    Physical Exam Updated Vital Signs Ht 4\' 11"  (1.499 m)   Wt 115 lb (52.2 kg)   LMP 10/28/2016 (Exact Date)   BMI 23.23 kg/m   Physical Exam  Constitutional: She is  oriented to person, place, and time. She appears well-developed and well-nourished.  HENT:  Head: Normocephalic.  Eyes: EOM are normal.  Neck: Normal range of motion.  Pulmonary/Chest: Effort normal.  Abdominal: She exhibits no distension.  Genitourinary:  Genitourinary Comments: Patient has marked swelling of the clitoris. It is fluctuant and erythematous.  Musculoskeletal: Normal range of motion.  Neurological: She is alert and oriented to person, place, and time.  Psychiatric: She has a normal mood and affect.  Nursing note and vitals reviewed.   ED Treatments / Results  COORDINATION OF CARE:  12:04 AM  Discussed treatment plan with pt at bedside and pt agreed to plan.  Labs (all labs ordered are listed, but only abnormal results are displayed) Labs Reviewed  URINALYSIS, ROUTINE W REFLEX MICROSCOPIC - Abnormal; Notable for the following:       Result Value   APPearance CLOUDY (*)    Hgb urine dipstick MODERATE (*)    Nitrite POSITIVE (*)    Leukocytes, UA MODERATE (*)    All other components within normal limits  URINALYSIS, MICROSCOPIC (REFLEX) - Abnormal; Notable for the following:    Bacteria, UA MANY (*)    Squamous Epithelial / LPF 6-30 (*)    All other components within normal limits  PREGNANCY, URINE    EKG  EKG Interpretation None       Radiology No results found.  Procedures Procedures (including critical care time)  Medications Ordered in ED Medications - No data to display   Initial Impression / Assessment and Plan / ED Course  I have reviewed the triage vital signs and the nursing notes.  Pertinent labs & imaging results that were available during my care of the patient were reviewed by me and considered in my medical decision making (see chart for details).  Patient presents here with an apparent abscess of the clitoris. She has a history of episiotomy from prior childbirth with an apparent clitoris tear. There was an area where the skin was very thin and purulent material was visible underneath. Topical lidocaine cream was applied and a small neck was made in the skin. Copious amounts of thick, foul-smelling pus was expressed.  She will be treated with pain medication, sitz baths, antibiotics, and follow-up with her gynecologist Monday.  INCISION AND DRAINAGE Performed by: Geoffery LyonseLo, Desta Bujak Consent: Verbal consent obtained. Risks and benefits: risks, benefits and alternatives were discussed Type: abscess  Body area: vagina/clitoris  Anesthesia: topical  Incision was made with a scalpel.  Local anesthetic: lidocaine 4%  Anesthetic total:  topical  Complexity: complex Blunt dissection to break up loculations  Drainage: purulent  Drainage amount: larger  Packing material: no packing placed  Patient tolerance: Patient tolerated the procedure well with no immediate complications.   Final Clinical Impressions(s) / ED Diagnoses   Final diagnoses:  None    New Prescriptions New Prescriptions   No medications on file  I personally performed the services described in this documentation, which was scribed in my presence. The recorded information has been reviewed and is accurate.        Geoffery Lyonsouglas Phyllis Whitefield, MD 11/01/16 [phone]

## 2016-10-31 NOTE — ED Triage Notes (Signed)
Pain and swelling and discoloration to clitoral area for past week. Has a tear to area with childbirth in Aug 2017 .

## 2016-11-01 MED ORDER — LIDOCAINE 4 % EX CREA
TOPICAL_CREAM | CUTANEOUS | Status: AC
  Administered 2016-11-01: 1
  Filled 2016-11-01: qty 5

## 2016-11-01 MED ORDER — METRONIDAZOLE 500 MG PO TABS: 500.0000 mg | ORAL_TABLET | Freq: Three times a day (TID) | ORAL | 0 refills | Status: DC

## 2016-11-01 MED ORDER — OXYCODONE-ACETAMINOPHEN 5-325 MG PO TABS
2.0000 | ORAL_TABLET | Freq: Once | ORAL | Status: AC
  Administered 2016-11-01: 2 via ORAL
  Filled 2016-11-01: qty 2

## 2016-11-01 MED ORDER — LIDOCAINE-PRILOCAINE 2.5-2.5 % EX CREA
TOPICAL_CREAM | Freq: Once | CUTANEOUS | Status: DC
  Filled 2016-11-01: qty 5

## 2016-11-01 MED ORDER — DOXYCYCLINE HYCLATE 100 MG PO CAPS: 100.0000 mg | ORAL_CAPSULE | Freq: Two times a day (BID) | ORAL | 0 refills | Status: DC

## 2016-11-01 MED ORDER — OXYCODONE-ACETAMINOPHEN 5-325 MG PO TABS: 1.0000 | ORAL_TABLET | Freq: Four times a day (QID) | ORAL | 0 refills | Status: DC | PRN

## 2016-11-01 NOTE — Discharge Instructions (Signed)
Doxycycline and Flagyl as prescribed.  Perform sitz baths as frequently as possible for the next several days.  Follow-up with your gynecologist on Monday for a recheck, and return to the ER if your symptoms significantly worsen or change.

## 2016-11-01 NOTE — ED Notes (Signed)
Pt given d/c instructions as per chart. Rx x 3 with narcotic precaution instructions. Verbalizes understanding. No questions.

## 2017-02-17 ENCOUNTER — Encounter (HOSPITAL_BASED_OUTPATIENT_CLINIC_OR_DEPARTMENT_OTHER): Payer: Self-pay

## 2017-02-17 ENCOUNTER — Emergency Department (HOSPITAL_BASED_OUTPATIENT_CLINIC_OR_DEPARTMENT_OTHER)
Admission: EM | Admit: 2017-02-17 | Discharge: 2017-02-17 | Disposition: A | Discharge status: Home / Self Care | Payer: Medicaid Other | Attending: Emergency Medicine | Admitting: Emergency Medicine

## 2017-02-17 DIAGNOSIS — R1032 Left lower quadrant pain: Secondary | ICD-10-CM | POA: Diagnosis present

## 2017-02-17 DIAGNOSIS — N10 Acute tubulo-interstitial nephritis: Secondary | ICD-10-CM | POA: Insufficient documentation

## 2017-02-17 DIAGNOSIS — F17210 Nicotine dependence, cigarettes, uncomplicated: Secondary | ICD-10-CM | POA: Diagnosis not present

## 2017-02-17 DIAGNOSIS — N12 Tubulo-interstitial nephritis, not specified as acute or chronic: Secondary | ICD-10-CM

## 2017-02-17 DIAGNOSIS — Z3A01 Less than 8 weeks gestation of pregnancy: Secondary | ICD-10-CM | POA: Diagnosis not present

## 2017-02-17 HISTORY — DX: Urinary tract infection, site not specified: N39.0

## 2017-02-17 LAB — URINALYSIS, ROUTINE W REFLEX MICROSCOPIC
Bilirubin Urine: NEGATIVE
Glucose, UA: NEGATIVE mg/dL
KETONES UR: NEGATIVE mg/dL
NITRITE: POSITIVE — AB
Protein, ur: 100 mg/dL — AB
Specific Gravity, Urine: 1.015 (ref 1.005–1.030)
pH: 7 (ref 5.0–8.0)

## 2017-02-17 LAB — URINALYSIS, MICROSCOPIC (REFLEX)

## 2017-02-17 MED ORDER — FOSFOMYCIN TROMETHAMINE 3 G PO PACK
3.0000 g | PACK | Freq: Once | ORAL | Status: AC
  Administered 2017-02-17: 3 g via ORAL
  Filled 2017-02-17: qty 3

## 2017-02-17 MED ORDER — FOSFOMYCIN TROMETHAMINE 3 G PO PACK: 3.0000 g | PACK | ORAL | 0 refills | Status: AC

## 2017-02-17 NOTE — ED Notes (Signed)
Paged [facility] @ 2130

## 2017-02-17 NOTE — ED Provider Notes (Addendum)
[facility]   CSN: [id number] Arrival date & time: 02/17/17  4098  By signing my name below, I, Rosana Fret, attest that this documentation has been prepared under the direction and in the presence of Alvira Monday, MD. Electronically Signed: Rosana Fret, ED Scribe. 02/17/17. 9:15 PM.  History   Chief Complaint Chief Complaint  Patient presents with  . Flank Pain   The history is provided by the patient. No language interpreter was used.   HPI Comments: Melissa [name] is a [redacted] week pregnant female 26 y.o. female with a PMHx of UTI, who presents to the Emergency Department complaining of intermittent left flank pain onset this morning. Pt notes her pain varies based on her positioning; it is a 5/10 seated and a 3/10 laying still. Pt describes pain as a pressure sensation that radiates from her left upper abdomen to her back. Pt reports associated nausea and vomiting (2-3 times a day) and occasional lower abdominal pain. Reports nausea and vomiting has been present since pregnancy began, and now has been improving. Has been off of nausea medications for the last 2 days with emesis x 3 per day. G4 P3 A0. Pt denies dysuria, urinary frequency, fever,  diarrhea, constipation, vaginal bleeding, vaginal discharge or any other complaints at this time.  Past Medical History:  Diagnosis Date  . UTI (urinary tract infection)     Patient Active Problem List   Diagnosis Date Noted  . 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 Supervision of other normal pregnancy 01/31/2014  . 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 UTI (urinary tract infection) in pregnancy in first trimester 01/31/2014    Past Surgical History:  Procedure Laterality Date  . CERVICAL BIOPSY      OB History    Gravida Para Term Preterm AB Living   3 1 1     1    SAB TAB Ectopic Multiple Live Births           1       Home Medications    Prior to Admission medications   Medication Sig Start Date End Date  Taking? Authorizing Provider  Doxylamine-Pyridoxine (DICLEGIS PO) Take by mouth.   Yes [provider]  fosfomycin (MONUROL) 3 g PACK Take 3 g by mouth every 3 (three) days. 02/19/17 03/04/17  Alvira Monday, MD  prenatal vitamin w/FE, FA (PRENATAL 1 + 1) 27-1 MG TABS tablet 1 tablet daily 11/24/13   Cyril Mourning A, NP  promethazine (PHENERGAN) 12.5 MG tablet Take 1 tablet (12.5 mg total) by mouth every 6 (six) hours as needed for nausea or vomiting. 01/31/14   Cheral Marker, CNM    Family History Family History  Problem Relation Age of Onset  . Urinary tract infection Mother   . Allergies Father   . Cancer Paternal Grandmother   . Asthma Brother     Social History Social History  Substance Use Topics  . Smoking status: Current Every Day Smoker    Types: Cigarettes  . Smokeless tobacco: Never Used  . Alcohol use No     Allergies   Keflex [cephalexin]   Review of Systems Review of Systems  Constitutional: Negative for chills and fever.  HENT: Negative for sore throat.   Eyes: Negative for visual disturbance.  Respiratory: Negative for cough and shortness of breath.   Cardiovascular: Negative for chest pain.  Gastrointestinal: Positive for abdominal pain, nausea and vomiting. Negative for constipation and diarrhea.  Genitourinary: Positive for flank pain. Negative for  difficulty urinating, dysuria, frequency, urgency, vaginal bleeding and vaginal discharge.  Musculoskeletal: Negative for back pain and neck pain.  Skin: Negative for rash.  Neurological: Negative for syncope and headaches.     Physical Exam Updated Vital Signs BP 121/62 (BP Location: Right Arm)   Pulse 75   Temp 99.6 F (37.6 C) (Oral)   Resp 18   Ht 4\' 11"  (1.499 m)   Wt 52.6 kg (116 lb)   LMP 01/08/2017   SpO2 100%   BMI 23.43 kg/m   Physical Exam  Constitutional: She is oriented to person, place, and time. She appears well-developed and well-nourished. No distress.  HENT:    Head: Normocephalic and atraumatic.  Eyes: Conjunctivae and EOM are normal.  Neck: Normal range of motion.  Cardiovascular: Normal rate, regular rhythm, normal heart sounds and intact distal pulses.  Exam reveals no gallop and no friction rub.   No murmur heard. Pulmonary/Chest: Effort normal and breath sounds normal. No respiratory distress. She has no wheezes. She has no rales.  Abdominal: Soft. She exhibits no distension. There is tenderness (LUQ, LLQ inconsistently). There is no guarding.  Left sided CVA tenderness.   Musculoskeletal: She exhibits no edema or tenderness.  Neurological: She is alert and oriented to person, place, and time.  Skin: Skin is warm and dry. No rash noted. She is not diaphoretic. No erythema.  Nursing note and vitals reviewed.    ED Treatments / Results  DIAGNOSTIC STUDIES: Oxygen Saturation is 99% on RA, normal by my interpretation.   COORDINATION OF CARE: 9:06 PM-Discussed next steps with pt including diagnosis of a UTI and consult with OB.Pt verbalized understanding and is agreeable with the plan.   Labs (all labs ordered are listed, but only abnormal results are displayed) Labs Reviewed  URINALYSIS, ROUTINE W REFLEX MICROSCOPIC - Abnormal; Notable for the following:       Result Value   APPearance CLOUDY (*)    Hgb urine dipstick MODERATE (*)    Protein, ur 100 (*)    Nitrite POSITIVE (*)    Leukocytes, UA LARGE (*)    All other components within normal limits  URINALYSIS, MICROSCOPIC (REFLEX) - Abnormal; Notable for the following:    Bacteria, UA MANY (*)    Squamous Epithelial / LPF 6-30 (*)    All other components within normal limits    EKG  EKG Interpretation None       Radiology No results found.  Procedures Procedures (including critical care time)  Medications Ordered in ED Medications  fosfomycin (MONUROL) packet 3 g (3 g Oral Given 02/17/17 2231)   EMERGENCY DEPARTMENT US PREGNANCY "Study: Limited Ultrasound of  the Pelvis for Pregnancy"  INDICATIONS:Pregnancy(required), flank pain Multiple views of the uterus and pelvic cavity were obtained in real-time with a multi-frequency probe.  APPROACH:Transvaginal PERFORMED BY: Myself IMAGES ARCHIVED?: Yes LIMITATIONS: none PREGNANCY FREE FLUID: None ADNEXAL FINDINGS:none GESTATIONAL AGE, ESTIMATE: 5wk FETAL HEART RATE: INTERPRETATION: Intrauterine gestational sac noted and Yolk sac noted      Initial Impression / Assessment and Plan / ED Course  I have reviewed the triage vital signs and the nursing notes.  Pertinent labs & imaging results that were available during my care of the patient were reviewed by me and considered in my medical decision making (see chart for details).     26yo [id number] at approximately 5-[redacted]wk gestation by LMP presents with left flank pain.  Urinalysis consistent with UTI and given flank pain present, suspect pyelonephritis.  Discussed  with pt we are unable to do official US at this time, however limited bedside US does show intrauterine pregnancy and given presence of urinary tract infection and symptoms more consistent with pyelo I also clinically have low suspicion for ectopic pregnancy.   Discussed patient with OBGYN, Dr. Cliffton Asters who is on call for her OB group. Dr. Cliffton Asters suggests that if she is otherwise afebrile and had symptoms starting today, her pyelonephritis could be managed as an outpatient.  She has anaphylaxis to cephalosporins, limiting abx choices for her in pregnancy.  Will give fosfomycin (initial dose in ED) every 3 days for 2 weeks and recommend very strict return precautions and close follow up with OBGYN.  Patient discharged in stable condition with understanding of reasons to return.    Final Clinical Impressions(s) / ED Diagnoses   Final diagnoses:  Pyelonephritis    New Prescriptions Discharge Medication List as of 02/17/2017 10:32 PM    START taking these medications   Details  fosfomycin  (MONUROL) 3 g PACK Take 3 g by mouth every 3 (three) days., Starting Fri 02/19/2017, Until Thu 03/04/2017, Print       I personally performed the services described in this documentation, which was scribed in my presence. The recorded information has been reviewed and is accurate.     Alvira Monday, MD 02/18/17 9562    Alvira Monday, MD 04/02/17 1041

## 2017-02-17 NOTE — ED Triage Notes (Addendum)
C/o left flank. Lower back, abd pain x today-states she is 5-[redacted] weeks pregnant-OB confirmed preg-denies vaginal d/c and bleeding-NAD-steady gait

## 2017-02-20 ENCOUNTER — Telehealth (HOSPITAL_BASED_OUTPATIENT_CLINIC_OR_DEPARTMENT_OTHER): Payer: Self-pay | Admitting: Emergency Medicine

## 2017-04-30 ENCOUNTER — Encounter (HOSPITAL_BASED_OUTPATIENT_CLINIC_OR_DEPARTMENT_OTHER): Payer: Self-pay

## 2017-04-30 ENCOUNTER — Emergency Department (HOSPITAL_BASED_OUTPATIENT_CLINIC_OR_DEPARTMENT_OTHER): Payer: No Typology Code available for payment source

## 2017-04-30 ENCOUNTER — Emergency Department (HOSPITAL_BASED_OUTPATIENT_CLINIC_OR_DEPARTMENT_OTHER)
Admission: EM | Admit: 2017-04-30 | Discharge: 2017-04-30 | Disposition: A | Discharge status: Home / Self Care | Payer: No Typology Code available for payment source | Attending: Emergency Medicine | Admitting: Emergency Medicine

## 2017-04-30 DIAGNOSIS — O9989 Other specified diseases and conditions complicating pregnancy, childbirth and the puerperium: Secondary | ICD-10-CM | POA: Insufficient documentation

## 2017-04-30 DIAGNOSIS — O99332 Smoking (tobacco) complicating pregnancy, second trimester: Secondary | ICD-10-CM | POA: Diagnosis not present

## 2017-04-30 DIAGNOSIS — Z3A16 16 weeks gestation of pregnancy: Secondary | ICD-10-CM | POA: Insufficient documentation

## 2017-04-30 DIAGNOSIS — S6992XA Unspecified injury of left wrist, hand and finger(s), initial encounter: Secondary | ICD-10-CM | POA: Diagnosis present

## 2017-04-30 DIAGNOSIS — S63502A Unspecified sprain of left wrist, initial encounter: Secondary | ICD-10-CM | POA: Diagnosis not present

## 2017-04-30 DIAGNOSIS — Y999 Unspecified external cause status: Secondary | ICD-10-CM | POA: Insufficient documentation

## 2017-04-30 DIAGNOSIS — Y939 Activity, unspecified: Secondary | ICD-10-CM | POA: Insufficient documentation

## 2017-04-30 DIAGNOSIS — F17210 Nicotine dependence, cigarettes, uncomplicated: Secondary | ICD-10-CM | POA: Diagnosis not present

## 2017-04-30 DIAGNOSIS — Y929 Unspecified place or not applicable: Secondary | ICD-10-CM | POA: Diagnosis not present

## 2017-04-30 DIAGNOSIS — S0502XA Injury of conjunctiva and corneal abrasion without foreign body, left eye, initial encounter: Secondary | ICD-10-CM | POA: Diagnosis not present

## 2017-04-30 DIAGNOSIS — Z79899 Other long term (current) drug therapy: Secondary | ICD-10-CM | POA: Diagnosis not present

## 2017-04-30 MED ORDER — TOBRAMYCIN 0.3 % OP SOLN
OPHTHALMIC | Status: AC
  Filled 2017-04-30: qty 5

## 2017-04-30 MED ORDER — TOBRAMYCIN 0.3 % OP SOLN: 1.0000 [drp] | Freq: Once | OPHTHALMIC | Status: DC

## 2017-04-30 MED ORDER — FLUORESCEIN SODIUM 0.6 MG OP STRP
1.0000 | ORAL_STRIP | Freq: Once | OPHTHALMIC | Status: AC
  Administered 2017-04-30: 1 via OPHTHALMIC
  Filled 2017-04-30: qty 1

## 2017-04-30 NOTE — Discharge Instructions (Signed)
Wear patch on eye until pain free. Use drops in eye every 6-8 hours. Call Dr. Tawny AsalBottorff on Tuesday for follow up exam of your eye. Wear wrist splint until pain free.
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
## 2017-04-30 NOTE — ED Notes (Signed)
ED Provider at bedside. 
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
## 2022-04-10 ENCOUNTER — Encounter (HOSPITAL_BASED_OUTPATIENT_CLINIC_OR_DEPARTMENT_OTHER): Payer: Self-pay

## 2022-04-10 ENCOUNTER — Emergency Department
Admission: EM | Admit: 2022-04-10 | Discharge: 2022-04-10 | Disposition: A | Discharge status: Home / Self Care | Payer: Self-pay | Attending: Physician Assistant | Admitting: Physician Assistant

## 2022-04-10 ENCOUNTER — Other Ambulatory Visit: Payer: Self-pay

## 2022-04-10 DIAGNOSIS — F17210 Nicotine dependence, cigarettes, uncomplicated: Secondary | ICD-10-CM | POA: Insufficient documentation

## 2022-04-10 DIAGNOSIS — Z20822 Contact with and (suspected) exposure to covid-19: Secondary | ICD-10-CM | POA: Insufficient documentation

## 2022-04-10 DIAGNOSIS — N390 Urinary tract infection, site not specified: Secondary | ICD-10-CM | POA: Insufficient documentation

## 2022-04-10 LAB — CBC WITH DIFF
BASOPHIL #: 0.02 10*3/uL (ref 0.00–0.30)
BASOPHIL %: 0 % (ref 0–3)
EOSINOPHIL #: 0.05 10*3/uL (ref 0.00–0.80)
EOSINOPHIL %: 1 % (ref 0–7)
HCT: 32.1 % — ABNORMAL LOW (ref 37.0–47.0)
HGB: 10.1 g/dL — ABNORMAL LOW (ref 12.5–16.0)
LYMPHOCYTE #: 1.26 10*3/uL (ref 1.10–5.00)
LYMPHOCYTE %: 20 % — ABNORMAL LOW (ref 25–45)
MCH: 22.4 pg — ABNORMAL LOW (ref 27.0–32.0)
MCHC: 31.4 g/dL — ABNORMAL LOW (ref 32.0–36.0)
MCV: 71.4 fL — ABNORMAL LOW (ref 78.0–99.0)
MONOCYTE #: 0.41 10*3/uL (ref 0.00–1.30)
MONOCYTE %: 7 % (ref 0–12)
MPV: 9 fL (ref 7.4–10.4)
NEUTROPHIL #: 4.51 10*3/uL (ref 1.80–8.40)
NEUTROPHIL %: 72 % (ref 40–76)
PLATELETS: 336 10*3/uL (ref 140–440)
RBC: 4.5 10*6/uL (ref 4.20–5.40)
RDW: 19.5 % — ABNORMAL HIGH (ref 11.6–14.8)
WBC: 6.3 10*3/uL (ref 4.0–10.5)

## 2022-04-10 LAB — COMPREHENSIVE METABOLIC PANEL, NON-FASTING
ALBUMIN/GLOBULIN RATIO: 1.1 (ref 0.8–1.4)
ALBUMIN: 4.1 g/dL (ref 3.4–5.0)
ALKALINE PHOSPHATASE: 92 U/L (ref 46–116)
ALT (SGPT): 11 U/L (ref <=78)
ANION GAP: 11 mmol/L (ref 10–20)
AST (SGOT): 23 U/L (ref 15–37)
BILIRUBIN TOTAL: 0.3 mg/dL (ref 0.2–1.0)
BUN/CREA RATIO: 26
BUN: 19 mg/dL — ABNORMAL HIGH (ref 7–18)
CALCIUM, CORRECTED: 8.8 mg/dL
CALCIUM: 8.9 mg/dL (ref 8.5–10.1)
CHLORIDE: 104 mmol/L (ref 98–107)
CO2 TOTAL: 27 mmol/L (ref 21–32)
CREATININE: 0.74 mg/dL (ref 0.55–1.02)
ESTIMATED GFR: 112 mL/min/{1.73_m2} (ref >59)
GLOBULIN: 3.6
GLUCOSE: 107 mg/dL — ABNORMAL HIGH (ref 74–106)
OSMOLALITY, CALCULATED: 286 mOsm/kg (ref 270–290)
POTASSIUM: 3.7 mmol/L (ref 3.5–5.1)
PROTEIN TOTAL: 7.7 g/dL (ref 6.4–8.2)
SODIUM: 142 mmol/L (ref 136–145)

## 2022-04-10 LAB — URINALYSIS, MACRO/MICRO
GLUCOSE: NEGATIVE mg/dL
NITRITE: POSITIVE — AB
PH: 5.5 (ref 4.6–8.0)
PROTEIN: 100 mg/dL — AB
SPECIFIC GRAVITY: >=1.03 (ref 1.003–1.035)
UROBILINOGEN: 1 mg/dL (ref 0.2–1.0)

## 2022-04-10 LAB — LIPASE: LIPASE: 92 U/L (ref 73–393)

## 2022-04-10 LAB — COVID-19, FLU A/B, RSV RAPID BY PCR
INFLUENZA VIRUS TYPE A: NOT DETECTED
INFLUENZA VIRUS TYPE B: NOT DETECTED
RESPIRATORY SYNCTIAL VIRUS (RSV): NOT DETECTED
SARS-CoV-2: NOT DETECTED

## 2022-04-10 LAB — URINALYSIS, MICROSCOPIC

## 2022-04-10 LAB — HCG, URINE QUALITATIVE, PREGNANCY: HCG URINE QUALITATIVE: NEGATIVE

## 2022-04-10 LAB — POC BLOOD GLUCOSE (RESULTS): GLUCOSE, POC: 138 mg/dl (ref 50–500)

## 2022-04-10 LAB — MAGNESIUM: MAGNESIUM: 2.1 mg/dL (ref 1.8–2.4)

## 2022-04-10 LAB — THYROID STIMULATING HORMONE (SENSITIVE TSH): TSH: 0.979 u[IU]/mL (ref 0.358–3.740)

## 2022-04-10 MED ORDER — KETOROLAC 30 MG/ML (1 ML) INJECTION SOLUTION
INTRAMUSCULAR | Status: AC
Start: 2022-04-10 — End: 2022-04-10
  Filled 2022-04-10: qty 1

## 2022-04-10 MED ORDER — KETOROLAC 30 MG/ML (1 ML) INJECTION SOLUTION
30.0000 mg | INTRAMUSCULAR | Status: AC
Start: 2022-04-10 — End: 2022-04-10
  Administered 2022-04-10: 30 mg via INTRAVENOUS

## 2022-04-10 MED ORDER — FLUCONAZOLE 150 MG TABLET
150.0000 mg | ORAL_TABLET | Freq: Once | ORAL | 0 refills | Status: AC
Start: 2022-04-10 — End: 2022-04-10

## 2022-04-10 MED ORDER — NITROFURANTOIN MONOHYDRATE/MACROCRYSTALS 100 MG CAPSULE
100.0000 mg | ORAL_CAPSULE | Freq: Two times a day (BID) | ORAL | 0 refills | Status: AC
Start: 2022-04-10 — End: 2022-04-15

## 2022-04-10 MED ORDER — LACTATED RINGERS IV BOLUS
1000.0000 mL | INJECTION | Status: AC
Start: 2022-04-10 — End: 2022-04-10
  Administered 2022-04-10: 1000 mL via INTRAVENOUS
  Administered 2022-04-10: 0 mL via INTRAVENOUS

## 2022-04-10 NOTE — Discharge Instructions (Addendum)
Antibiotic as directed 5 days until complete.  Diflucan as needed.  Stay hydrated by drinking plenty of water.  Below is a list of primary care providers, call one of them to become established.  If anything changes, gets worse, or does not improve return to the emergency department.    Primary Care Providers    [facility] [phone]    [facility] [phone]    [facility]-Dr. Mariah Milling [phone]    [facility]-Dr. Renelda Loma [phone]    [facility]-Dr. Victorino Sparrow [phone]    [facility]-Dr. Emilia Beck [phone]    [facility]-Dr. Earlean Shawl [phone]    [facility]- [phone]    [facility] [phone]    [facility] [phone]    [facility]-Dr. Baldomero Lamy [phone]    [facility] -Dr. Weyman Pedro [phone]    [facility]-  [phone]

## 2022-04-10 NOTE — ED Nurses Note (Signed)
Pt c/o feeling weak and dizzy at work. 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 Pt states that she sat down and drank an entire bottle of mountain dew. Pt states that after drinking the soda her stomach cramped and she vomited. Pt reports currently feeling very weak and tired.

## 2022-04-10 NOTE — ED Nurses Note (Signed)
Toradol shot given to pt per NP orders to help with headache. Pt reports headache at time of Toradol administration at a 6/10 on the pain scale. Pt also provided with a sandwich tray and water to drink.

## 2022-04-10 NOTE — ED Provider Notes (Signed)
[facility], [facility] Emergency Department  Primary Provider Note      CHIEF COMPLAINT  Chief Complaint   Patient presents with    Weakness     HISTORY OF PRESENT ILLNESS  Erica Morrison, date of birth 1990/12/30, is a 31 y.o. female who presented to the Emergency Department with weakness and fatigue.  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  She reports that she got her started sweating and her "hearing went out".  She reports that she thought she was dehydrated so she chugged a bottle of mountain dew, after which she threw up.  She denies any pertinent past medical history.  She reports that she recently moved to the area and does not have a PCP.  No other complaints or concerns.     PAST MEDICAL/SURGICAL/FAMILY/SOCIAL HISTORY  History reviewed. No pertinent past medical history.    History reviewed. No past surgical history pertinent negatives.    Family Medical History:    None       Social History     Socioeconomic History    Marital status: Single   Tobacco Use    Smoking status: Every Day     Packs/day: 0.50     Types: Cigarettes    Smokeless tobacco: Never   Substance and Sexual Activity    Alcohol use: Never    Drug use: Never      ALLERGIES  Allergies   Allergen Reactions    Keflex [Cephalexin]     Metronidazole        PHYSICAL EXAM  VITAL SIGNS:  Filed Vitals:    04/10/22 2100 04/10/22 2115 04/10/22 2130 04/10/22 2145   BP: (!) 104/59  115/63    Pulse: 81 90 87 81   Resp: 19 16 (!) 23 16   Temp:       SpO2: 98% 98% 100% 98%       General: No distress.   HENT:  Head: Normocephalic and atraumatic.  Mouth/Throat: Oropharynx is clear and moist. Uvula midline.   Eyes: EOMI, PERRL. normal conjunctiva without injection, lids without swelling or exudate  Ears: Hearing grossly intact.  TMs normal bilaterally  Neck: Trachea midline. Neck supple.  Cardiovascular: RRR.    Respiratory: CTAB, normal work of breathing w/o retractions or  accessory muscle accessory muscle use  Abdominal: Bowel sounds present and normal. Abdomen soft, non-tender, non-distended, no rebound and no guarding.    GU: Deferred  Rectal: Deferred  Musculoskeletal: No edema, tenderness or deformity.  Skin: Warm and dry. No obvious lesion, rash, ulcers, erythema, pallor or cyanosis  Psychiatric: normal mood and affect. Behavior is normal.  Neurological: A&O x3, no acute deficit or focal weakness. Moves all extremities. Able to ambulate without assistance.  Cranial nerves 2-12 grossly intact      DIAGNOSTICS  Labs:  Labs listed below were reviewed and interpreted by me.  Results for orders placed or performed during the hospital encounter of 04/10/22   COMPREHENSIVE METABOLIC PANEL, NON-FASTING   Result Value Ref Range    SODIUM 142 136 - 145 mmol/L    POTASSIUM 3.7 3.5 - 5.1 mmol/L    CHLORIDE 104 98 - 107 mmol/L    CO2 TOTAL 27 21 - 32 mmol/L    ANION GAP 11 10 - 20 mmol/L    BUN 19 (H) 7 - 18 mg/dL    CREATININE 9.24 2.68 - 1.02 mg/dL  BUN/CREA RATIO 26     ESTIMATED GFR 112 >59 mL/min/1.27m^2    ALBUMIN 4.1 3.4 - 5.0 g/dL    CALCIUM 8.9 8.5 - 60.1 mg/dL    GLUCOSE 093 (H) 74 - 106 mg/dL    ALKALINE PHOSPHATASE 92 46 - 116 U/L    ALT (SGPT) 11 <=78 U/L    AST (SGOT) 23 15 - 37 U/L    BILIRUBIN TOTAL 0.3 0.2 - 1.0 mg/dL    PROTEIN TOTAL 7.7 6.4 - 8.2 g/dL    ALBUMIN/GLOBULIN RATIO 1.1 0.8 - 1.4    OSMOLALITY, CALCULATED 286 270 - 290 mOsm/kg    CALCIUM, CORRECTED 8.8 mg/dL    GLOBULIN 3.6    MAGNESIUM   Result Value Ref Range    MAGNESIUM 2.1 1.8 - 2.4 mg/dL   LIPASE   Result Value Ref Range    LIPASE 92 73 - 393 U/L   HCG, URINE QUALITATIVE, PREGNANCY   Result Value Ref Range    HCG URINE QUALITATIVE Negative    THYROID STIMULATING HORMONE (SENSITIVE TSH)   Result Value Ref Range    TSH 0.979 0.358 - 3.740 uIU/mL   COVID-19, FLU A/B, RSV RAPID BY PCR   Result Value Ref Range    SARS-CoV-2 Not Detected Not Detected    INFLUENZA VIRUS TYPE A Not Detected Not Detected     INFLUENZA VIRUS TYPE B Not Detected Not Detected    RESPIRATORY SYNCTIAL VIRUS (RSV) Not Detected Not Detected   CBC WITH DIFF   Result Value Ref Range    WBCS UNCORRECTED      WBC 6.3 4.0 - 10.5 x10^3/uL    RBC 4.50 4.20 - 5.40 x10^6/uL    HGB 10.1 (L) 12.5 - 16.0 g/dL    HCT 23.5 (L) 57.3 - 47.0 %    MCV 71.4 (L) 78.0 - 99.0 fL    MCH 22.4 (L) 27.0 - 32.0 pg    MCHC 31.4 (L) 32.0 - 36.0 g/dL    RDW 22.0 (H) 25.4 - 14.8 %    PLATELETS 336 140 - 440 x10^3/uL    MPV 9.0 7.4 - 10.4 fL    NEUTROPHIL % 72 40 - 76 %    LYMPHOCYTE % 20 (L) 25 - 45 %    MONOCYTE % 7 0 - 12 %    EOSINOPHIL % 1 0 - 7 %    BASOPHIL % 0 0 - 3 %    NEUTROPHIL # 4.51 1.80 - 8.40 x10^3/uL    LYMPHOCYTE # 1.26 1.10 - 5.00 x10^3/uL    MONOCYTE # 0.41 0.00 - 1.30 x10^3/uL    EOSINOPHIL # 0.05 0.00 - 0.80 x10^3/uL    BASOPHIL # 0.02 0.00 - 0.30 x10^3/uL   URINALYSIS, MACRO/MICRO   Result Value Ref Range    COLOR Yellow Yellow    APPEARANCE Clear Clear    SPECIFIC GRAVITY >=1.030 1.003 - 1.035    PH 5.5 4.6 - 8.0    LEUKOCYTES Trace (A) Negative WBCs/uL    NITRITE Positive (A) Negative    PROTEIN 100 (A) Negative mg/dL    GLUCOSE Negative Negative mg/dL    KETONES Trace (A) Negative mg/dL    BILIRUBIN Small (A) Negative mg/dL    BLOOD Trace (A) Negative mg/dL    UROBILINOGEN 1.0 0.2 - 1.0 mg/dL   URINALYSIS, MICROSCOPIC   Result Value Ref Range    RBCS 0-3 0-3, Not Present /hpf    BACTERIA Many (A) Negative /hpf  WBCS 6-10 (A) Not Present, Occasional, 0-5 /hpf    SQUAMOUS EPITHELIAL Few Not Present, Few /hpf   POC BLOOD GLUCOSE (RESULTS)   Result Value Ref Range    GLUCOSE, POC 138 50 - 500 mg/dl     Radiology:       ED COURSE/MEDICAL DECISION MAKING  Medications Administered in the ED   LR bolus infusion 1,000 mL (0 mL Intravenous Stopped 04/10/22 1953)   ketorolac (TORADOL) 30 mg/mL injection (30 mg Intravenous Given 04/10/22 2020)      ED Course as of 04/10/22 2200   Fri Apr 10, 2022   1912 GLUCOSE, POC: 138   1945 URINALYSIS WITH REFLEX  MICROSCOPIC AND CULTURE IF POSITIVE(!)      Medical Decision Making  Patient is a 31 year old female who presented to the Emergency Department with weakness and fatigue.  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  She reports that she got her started sweating and her "hearing went out".  She reports that she thought she was dehydrated so she chugged a bottle of mountain dew, after which she threw up.  She denies any pertinent past medical history.  She reports that she recently moved to the area and does not have a PCP.  Workup included point of care glucose 138.  UA, urine pregnancy CBC, CMP, Mag, lipase and TSH.  No electrolyte abnormalities noted.  Urinalysis consistent with urinary tract infection.  Patient was prescribed Macrobid and Diflucan.  She was also provided a list of local primary care providers.  She was given close return precautions, encouraged to follow up with a PCP as soon as possible.    Problems Addressed:  UTI (urinary tract infection): acute illness or injury    Amount and/or Complexity of Data Reviewed  Labs: ordered.  ECG/medicine tests: independent interpretation performed.    Risk  Prescription drug management.      CLINICAL IMPRESSION  Clinical Impression   UTI (urinary tract infection) (Primary)     DISPOSITION  Discharged       DISCHARGE MEDICATIONS  Current Discharge Medication List        START taking these medications    Details   fluconazole (DIFLUCAN) 150 mg Oral Tablet Take 1 Tablet (150 mg total) by mouth One time for 1 dose Repeat in 72 hours as needed.  Qty: 2 Tablet, Refills: 0      nitrofurantoin monohyd/m-cryst (MACROBID) 100 mg Oral Capsule Take 1 Capsule (100 mg total) by mouth Twice daily for 5 days  Qty: 10 Capsule, Refills: 0             /Ridgely Anastacio Caren Griffins   04/10/2022, 18:39   [facility]  Department of Emergency Medicine  [facility]    This note was partially generated using  MModal Fluency Direct system, and there may be some incorrect words, spellings, and punctuation that were not noted in checking the note before saving.    -----

## 2022-04-10 NOTE — ED Triage Notes (Signed)
Patient reports sitting down at lunch and 'I got hot and started sweating. My hearing went out and I thought I would pass out. Stomach started hurting and I have not felt right'. Denies any nausea, reports headache.

## 2022-04-10 NOTE — ED Nurses Note (Signed)
Pt advised verbally that headache is resolved with a rating of 1/10 on the pain scale. Discharge instructions discussed with pt with teach back to confirm understanding. Hard copy of discharge instructions provided to pt. Pt will schedule follow up appointment with a PCP within the next 72 hours. Pt will return to ED if symptoms return or worsen. Pt dc'd to home with self via ambulatory.

## 2022-04-13 LAB — URINE CULTURE,ROUTINE: URINE CULTURE: >100000 — AB

## 2022-04-14 ENCOUNTER — Other Ambulatory Visit: Payer: Self-pay

## 2022-08-31 NOTE — L&D Delivery Note (Signed)
[facility]    Delivery Summary Note      Name: Erica Morrison   MRN: [id number]  DOB:  1990/09/10  Admitted: 08/08/2023  1:56 AM       Pre-delivery diagnosis:    32 y.o. [id number] at [redacted]w[redacted]d gestation.   Fetal growth restriction   THC use in pregnancy     Post-delivery diagnosis:    Same plus delivery of a viable neonate.     Findings:           Delivery of viable term infant/s. Infant(s) suctioned. Cord clamped x2 and cut. Infant to maternal abdomen.  Cord blood collected.  Placenta delivered. Good hemostasis noted. See below for delivery details.     Disposition:  Infant(s) admitted to newborn nursery     "Delivery Information"      IO Blood Loss   Intrapartum & Postpartum: 08/08/23 0644 - 08/08/23 1813    Delivery Admission: 08/08/23 0156 - 08/10/23 1708         Intrapartum & Postpartum Delivery Admission    QBL - (OB only) Hospital Encounter 125 mL 125 mL    EBL Hospital Encounter 100 mL 100 mL    Total  225 mL 225 mL               Francee, Stanley [[id number]]      Labor Events    Preterm labor?: No  Antenatal steroids: None  Antibiotics received during labor?: No  Rupture date/time: 08/08/2023 0644  Rupture type: Artificial  Fluid color: Clear  Fluid odor: none  Labor type: Induced Onset of Labor  Induction: Misoprostol  Cervical Ripening date/time: 08/08/2023 0330  Induction date/time: 08/08/2023 0130  Indications for induction: Intrauterine Fetal Growth Restriction  Was labor allowed to proceed with plans for an attempted vaginal birth?: Yes  Labor complications: None              Labor Events    Preterm labor?: No  Antenatal steroids: None  Cervical Ripening: 08/08/2023 0330  Antibiotics received during labor?: No  Rupture date/time: 08/08/2023 0644  Rupture type: Artificial  Fluid color: Clear  Induction: Misoprostol  Induction date/time: 08/08/2023 0130  Indications for induction: Intrauterine Fetal Growth Restriction  Labor complications: None              Labor Event Times      Labor onset Date/Time:  08/08/2023 0644   Dilation complete Date/Time: 08/08/2023 1608       Delivery Anesthesia    Method: Epidural  Analgesics: ROPIVACAINE 0.1% EPIDURAL INFUSION-ADULT       Assisted Delivery Details    Forceps attempted?: No  Vacuum extractor attempted?: No       Shoulder Dystocia Maneuvers    Shoulder dystocia present?: No       Lacerations      Episiotomy: None   Perineal lacerations: None      Clitoral laceration: Yes Repaired: Yes   Repair suture: 4-0 Vicryl  Number of repair packets: 1       Delivery Information    Birth date/time: 08/08/2023 1613  Sex: Female  Delivery type: Vaginal, Spontaneous  Complications: None       Newborn Presentation    Presentation: Vertex  Position: Right Occiput Anterior       Cord Information    Vessels: 3 Vessels  Complications: Nuchal  Nuchal intervention: reduced  Nuchal cord description: loose nuchal cord  Number of loops: 1  Delayed cord clamping?: Yes  Cord clamped  date/time: 08/08/2023 1614  Cord blood disposition: Lab, Cord Segment Sent  Gases sent?: No  Stem cell collection (by MD): No       Resuscitation    Method: Suctioning       Newborn  Apgars    Living status: Living      Skin color:    Heart rate:    Reflex Irrit:    Muscle tone:    Resp. effort:    Total:     1 Min:    0    2    2    2    2    8     5  Min:    1    2    2    2    2    9     10  Min:     15 Min:     20 Min:       Apgars assigned by: Peggye Ley RN / Keturah Barre RN       Skin to Skin    Skin to skin initiation: 08/08/2023 1613  Skin to skin with: Mother  Skin to skin end: 08/08/2023 1715  Breastfeeding initiated: 08/08/2023 1650       Newborn Measurements    Weight: 2105 g  Length: 45.7 cm  Head circumference: 31.1 cm  Chest circumference: 29.2 cm       Placenta    Date/time: 08/08/2023 1616  Removal: Expressed  Appearance: Intact  Disposition: Discarded       Other Delivery Procedures    Procedures: None       Delivery Providers      Delivering Clinician: Kela Millin, DO   Provider Role   Hulan Amato, RN Registered Nurse   Keturah Barre, RN Baby Nurse   Day, Mardene Celeste, RN Baby Nurse   Ferol Luz, PAT CARE [facility] Tech   Julaine Hua, RN Registered Nurse                   Labor Event Times    Labor onset date/time: 08/08/2023 [phone]  Dilation complete date/time: 08/08/2023 1608       Labor Length      1st stage: 9h 44m   2nd stage: 0h 49m   3rd stage: Riki Altes 37m                 Kela Millin, DO 08/11/2023, 22:57

## 2022-12-30 ENCOUNTER — Encounter (HOSPITAL_COMMUNITY): Payer: Self-pay

## 2022-12-30 ENCOUNTER — Emergency Department
Admission: EM | Admit: 2022-12-30 | Discharge: 2022-12-30 | Discharge status: Against Medical Advice | Payer: MEDICAID | Attending: Physician Assistant | Admitting: Physician Assistant

## 2022-12-30 ENCOUNTER — Emergency Department (HOSPITAL_COMMUNITY): Payer: MEDICAID

## 2022-12-30 ENCOUNTER — Other Ambulatory Visit: Payer: Self-pay

## 2022-12-30 DIAGNOSIS — Z3A01 Less than 8 weeks gestation of pregnancy: Secondary | ICD-10-CM

## 2022-12-30 DIAGNOSIS — M549 Dorsalgia, unspecified: Secondary | ICD-10-CM

## 2022-12-30 DIAGNOSIS — O26891 Other specified pregnancy related conditions, first trimester: Secondary | ICD-10-CM | POA: Insufficient documentation

## 2022-12-30 DIAGNOSIS — Z3200 Encounter for pregnancy test, result unknown: Secondary | ICD-10-CM | POA: Insufficient documentation

## 2022-12-30 DIAGNOSIS — R102 Pelvic and perineal pain: Secondary | ICD-10-CM

## 2022-12-30 DIAGNOSIS — R109 Unspecified abdominal pain: Secondary | ICD-10-CM

## 2022-12-30 DIAGNOSIS — N390 Urinary tract infection, site not specified: Secondary | ICD-10-CM

## 2022-12-30 LAB — COMPREHENSIVE METABOLIC PANEL, NON-FASTING
ALBUMIN/GLOBULIN RATIO: 2 — ABNORMAL HIGH (ref 0.8–1.4)
ALBUMIN: 4.7 g/dL (ref 3.5–5.7)
ALKALINE PHOSPHATASE: 61 U/L (ref 34–104)
ALT (SGPT): <7 U/L — ABNORMAL LOW (ref 7–52)
ANION GAP: 8 mmol/L (ref 4–13)
AST (SGOT): 14 U/L (ref 13–39)
BILIRUBIN TOTAL: 0.2 mg/dL — ABNORMAL LOW (ref 0.3–1.2)
BUN/CREA RATIO: 25 — ABNORMAL HIGH (ref 6–22)
BUN: 13 mg/dL (ref 7–25)
CALCIUM, CORRECTED: 9.3 mg/dL (ref 8.9–10.8)
CALCIUM: 9.9 mg/dL (ref 8.6–10.3)
CHLORIDE: 105 mmol/L (ref 98–107)
CO2 TOTAL: 24 mmol/L (ref 21–31)
CREATININE: 0.53 mg/dL — ABNORMAL LOW (ref 0.60–1.30)
ESTIMATED GFR: 127 mL/min/{1.73_m2} (ref >59)
GLOBULIN: 2.4 — ABNORMAL LOW (ref 2.9–5.4)
GLUCOSE: 84 mg/dL (ref 74–109)
OSMOLALITY, CALCULATED: 273 mOsm/kg (ref 270–290)
POTASSIUM: 4.4 mmol/L (ref 3.5–5.1)
PROTEIN TOTAL: 7.1 g/dL (ref 6.4–8.9)
SODIUM: 137 mmol/L (ref 136–145)

## 2022-12-30 LAB — URINALYSIS, MACROSCOPIC
BILIRUBIN: NEGATIVE mg/dL
BLOOD: NEGATIVE mg/dL
GLUCOSE: NEGATIVE mg/dL
KETONES: NEGATIVE mg/dL
LEUKOCYTES: 75 WBCs/uL — AB
NITRITE: NEGATIVE
PH: 6.5 (ref 5.0–9.0)
PROTEIN: NEGATIVE mg/dL
SPECIFIC GRAVITY: 1.024 (ref 1.002–1.030)
UROBILINOGEN: 2 mg/dL — AB

## 2022-12-30 LAB — CBC WITH DIFF
BASOPHIL #: 0.1 10*3/uL (ref 0.00–0.10)
BASOPHIL %: 1 % (ref 0–1)
EOSINOPHIL #: 0.3 10*3/uL (ref 0.00–0.50)
EOSINOPHIL %: 2 %
HCT: 32.1 % (ref 31.2–41.9)
HGB: 10.2 g/dL — ABNORMAL LOW (ref 10.9–14.3)
LYMPHOCYTE #: 3.9 10*3/uL — ABNORMAL HIGH (ref 1.00–3.00)
LYMPHOCYTE %: 18 % (ref 16–44)
MCH: 23.8 pg — ABNORMAL LOW (ref 24.7–32.8)
MCHC: 31.8 g/dL — ABNORMAL LOW (ref 32.3–35.6)
MCV: 74.7 fL — ABNORMAL LOW (ref 75.5–95.3)
MONOCYTE #: 1.4 10*3/uL — ABNORMAL HIGH (ref 0.30–1.00)
MONOCYTE %: 6 % (ref 5–13)
MPV: 9.5 fL (ref 7.9–10.8)
NEUTROPHIL #: 15.7 10*3/uL — ABNORMAL HIGH (ref 1.85–7.80)
NEUTROPHIL %: 73 % (ref 43–77)
PLATELETS: 324 10*3/uL (ref 140–440)
RBC: 4.29 10*6/uL (ref 3.63–4.92)
RDW: 16.9 % (ref 12.3–17.7)
WBC: 21.5 10*3/uL — ABNORMAL HIGH (ref 3.8–11.8)

## 2022-12-30 LAB — URINALYSIS, MICROSCOPIC
BACTERIA: NEGATIVE /hpf
RBCS: 1 /hpf (ref <4)
SQUAMOUS EPITHELIAL: 2 /hpf (ref <28)
WBCS: 5 /hpf (ref <6)

## 2022-12-30 LAB — BLUE TOP TUBE

## 2022-12-30 LAB — ABO & RH: ABO/RH(D): O POS

## 2022-12-30 LAB — HCG, PLASMA OR SERUM QUANTITATIVE, PREGNANCY: HCG, QUANTITATIVE: 2608 m[IU]/mL — ABNORMAL HIGH (ref <=3.00)

## 2022-12-30 MED ORDER — ACETAMINOPHEN 325 MG TABLET
650.0000 mg | ORAL_TABLET | ORAL | Status: AC
Start: 2022-12-30 — End: 2022-12-30
  Administered 2022-12-30: 650 mg via ORAL

## 2022-12-30 MED ORDER — ACETAMINOPHEN 325 MG TABLET
ORAL_TABLET | ORAL | Status: AC
Start: 2022-12-30 — End: 2022-12-30
  Filled 2022-12-30: qty 2

## 2022-12-30 NOTE — ED Triage Notes (Addendum)
[redacted] weeks pregnant, lower abdominal and low back pain x1 hour.

## 2022-12-30 NOTE — ED Nurses Note (Signed)
PT RESTING QUIETLY ON STRETCHER, PT MEDICATED W/TYLENOL PER ORDER FOR PAIN IN PELVIS AND LOW BACK PAIN, CALL BELL AT BS

## 2022-12-30 NOTE — ED Nurses Note (Signed)
PT HAD GONE OVER TO U/S, AND WHEN THE TECH BROUGHT HER BACK, He SAID THAT She HAD RIPPED THAT PROBE OUT AND SAID She WAS DONE, He ESCORTED HER BACK TO THE ROOM, AND WHEN WE WENT TO THE ROOM, THE PT WAS GONE, I WALKED OUTSIDE TO SEE IF I COULD FIND HER BUT UNABLE TO FIND HER, ED PROVIDER NOTIFIED THAT PT HAD ELOPED

## 2022-12-30 NOTE — ED Provider Notes (Signed)
Emergency Medicine      Name: Erica Morrison  Age and Gender: 32 y.o. female  Date of Birth: 11-13-1990  MRN: [id number]  PCP: No Pcp    CC:  Chief Complaint   Patient presents with    Abdominal Pain       HPI:  Erica Morrison is a 32 y.o. White female who presents to the ER with lower abdominal and low back pain for the past 1 hour.  Patient states she is approximately [redacted] weeks gestation with LMP March 23rd.  She is G7 P5 A1.  She denies vaginal bleeding, dysuria, hematuria.    Below pertinent information reviewed with patient:  History reviewed. No pertinent past medical history.        Allergies   Allergen Reactions    Keflex [Cephalexin]     Metronidazole           Social History     Socioeconomic History    Marital status: Single   Tobacco Use    Smoking status: Every Day     Current packs/day: 0.50     Types: Cigarettes    Smokeless tobacco: Never   Substance and Sexual Activity    Alcohol use: Never    Drug use: Never       ROS:  No other overt positive review of systems are noted other than stated in the HPI.      Objective:    ED Triage Vitals [12/30/22 0626]   BP (Non-Invasive) (!) 111/97   Heart Rate (!) 106   Respiratory Rate 20   Temperature 36.6 C (97.9 F)   SpO2 98 %   Weight 45.8 kg (101 lb)   Height 1.499 m (4\' 11" )     Filed Vitals:    12/30/22 0626 12/30/22 0630   BP: (!) 111/97 127/84   Pulse: (!) 106    Resp: 20    Temp: 36.6 C (97.9 F)    SpO2: 98%        Nursing notes and vital signs reviewed.    Constitutional - No acute distress.  Alert and Active.  HEENT - Normocephalic. Conjunctiva clear. Moist mucous membranes.   Neck - Trachea midline. No stridor. No hoarseness.  Cardiac - Regular rate and rhythm. No murmurs, rubs, or gallops.   Respiratory/Chest - Normal respiratory effort. Clear to auscultation bilaterally. No rales, wheezes or rhonchi.   Abdomen - Normal bowel sounds. Tenderness, worse in the LLQ, soft, non-distended. No rebound or guarding.   Musculoskeletal - Good AROM. No  clubbing, cyanosis or edema.  Skin - Warm and dry, without any rashes or other lesions.  Neuro - Alert and oriented x 3. Moving all extremities symmetrically. Normal gait.  Psych - Normal mood and affect. Behavior is normal       Any pertinent labs and imaging obtained during this encounter reviewed below in MDM.    MDM/ED Course:      Medical Decision Making  Patient presents to the ER with lower abdominal and low back pain for the past 1 hour.  Patient states she is approximately [redacted] weeks gestation with LMP March 23rd.  She is G7 P5 A1.  She denies vaginal bleeding, dysuria, hematuria. Differential diagnoses include UTI, pelvic pain associated with pregnancy, constipation, ectopic pregnancy, tuboovarian abscess. Patient underwent diagnostics with results as noted in ED course. After patient's WBC resulted as elevated, I did go discuss her test results with her, and explained that I did not want  her to eat or drink until we got her ultrasound results back, as I was concerned for ectopic pregnancy. Patient verbalized understanding of this. However, shortly after this, I was made aware that the patient was not cooperative with the entire ultrasound, and actually pulled the ultrasound probe out prior to the end of the exam. The sonographer informed me that he walked the patient back to the room. However, when I immediately went to the patient's room to speak with her, she was not in the room or bathroom, and nursing staff could not find her outside of the ER. We presume she has eloped.    Amount and/or Complexity of Data Reviewed  Labs: ordered. Decision-making details documented in ED Course.  Radiology: ordered.  ECG/medicine tests: independent interpretation performed.    Risk  OTC drugs.        ED Course as of 12/30/22 0939   Wed Dec 30, 2022   1610 Was notified by ultrasound tech that the patient would not allow him to complete the exam. He says he was able to see an intrauterine pregnancy but she pulled the probe  out before he could finish the exam. I went to the patient's room to speak with her and found she is not in the room, bathroom, and we cannot find her outside of the ER. We presume she has eloped.   9604 COMPREHENSIVE METABOLIC PANEL, NON-FASTING(!)  Unremarkable     0934 URINALYSIS, MACROSCOPIC AND MICROSCOPIC W/CULTURE REFLEX(!)  Leukocytes 75, urobilinogen 2, many mucus, otherwise normal   0935 HCG, QUANTITATIVE(!): 2,608.00   0935 CBC/DIFF(!)  Wbc 21.5, Hgb 10.2, normal platelets       Orders Placed This Encounter    URINE CULTURE,ROUTINE    US TRANSVAGINAL OB  [id number]    URINALYSIS, MACROSCOPIC AND MICROSCOPIC W/CULTURE REFLEX    CBC/DIFF    COMPREHENSIVE METABOLIC PANEL, NON-FASTING    HCG, PLASMA OR SERUM QUANTITATIVE, PREGNANCY    URINALYSIS, MACROSCOPIC    URINALYSIS, MICROSCOPIC    CBC WITH DIFF    EXTRA TUBES    BLUE TOP TUBE    ABO & RH    acetaminophen (TYLENOL) tablet         Impression:   Clinical Impression   Pelvic pain in pregnancy (Primary)       Disposition: Eloped    / Maryagnes Amos PA-C  12/30/2022, 07:07  [facility]  Department of Emergency Medicine  [facility]    Portions of this note may have been dictated using voice recognition software.     -----------------------  Results for orders placed or performed during the hospital encounter of 12/30/22 (from the past 12 hour(s))   URINALYSIS, MACROSCOPIC   Result Value Ref Range    COLOR Yellow Colorless, Light Yellow, Yellow    APPEARANCE Turbid (A) Clear    SPECIFIC GRAVITY 1.024 1.002 - 1.030    PH 6.5 5.0 - 9.0    LEUKOCYTES 75 (A) Negative, 100  WBCs/uL    NITRITE Negative Negative    PROTEIN Negative Negative, 10 , 20  mg/dL    GLUCOSE Negative Negative, 30  mg/dL    KETONES Negative Negative, Trace mg/dL    BILIRUBIN Negative Negative, 0.5 mg/dL    BLOOD Negative Negative, 0.03 mg/dL    UROBILINOGEN 2 (A) Normal mg/dL   URINALYSIS, MICROSCOPIC   Result Value Ref Range    BACTERIA Negative Negative /hpf     MUCOUS Many (A) (none) /hpf  RBCS 1 <4 /hpf    WBCS 5 <6 /hpf    SQUAMOUS EPITHELIAL 2 <28 /hpf   COMPREHENSIVE METABOLIC PANEL, NON-FASTING   Result Value Ref Range    SODIUM 137 136 - 145 mmol/L    POTASSIUM 4.4 3.5 - 5.1 mmol/L    CHLORIDE 105 98 - 107 mmol/L    CO2 TOTAL 24 21 - 31 mmol/L    ANION GAP 8 4 - 13 mmol/L    BUN 13 7 - 25 mg/dL    CREATININE 1.61 (L) 0.60 - 1.30 mg/dL    BUN/CREA RATIO 25 (H) 6 - 22    ESTIMATED GFR 127 >59 mL/min/1.28m^2    ALBUMIN 4.7 3.5 - 5.7 g/dL    CALCIUM 9.9 8.6 - 09.6 mg/dL    GLUCOSE 84 74 - 045 mg/dL    ALKALINE PHOSPHATASE 61 34 - 104 U/L    ALT (SGPT) <7 (L) 7 - 52 U/L    AST (SGOT) 14 13 - 39 U/L    BILIRUBIN TOTAL 0.2 (L) 0.3 - 1.2 mg/dL    PROTEIN TOTAL 7.1 6.4 - 8.9 g/dL    ALBUMIN/GLOBULIN RATIO 2.0 (H) 0.8 - 1.4    OSMOLALITY, CALCULATED 273 270 - 290 mOsm/kg    CALCIUM, CORRECTED 9.3 8.9 - 10.8 mg/dL    GLOBULIN 2.4 (L) 2.9 - 5.4   HCG, PLASMA OR SERUM QUANTITATIVE, PREGNANCY   Result Value Ref Range    HCG, QUANTITATIVE 2,608.00 (H) <=3.00 mIU/mL   CBC WITH DIFF   Result Value Ref Range    WBC 21.5 (H) 3.8 - 11.8 x10^3/uL    RBC 4.29 3.63 - 4.92 x10^6/uL    HGB 10.2 (L) 10.9 - 14.3 g/dL    HCT 40.9 81.1 - 91.4 %    MCV 74.7 (L) 75.5 - 95.3 fL    MCH 23.8 (L) 24.7 - 32.8 pg    MCHC 31.8 (L) 32.3 - 35.6 g/dL    RDW 78.2 95.6 - 21.3 %    PLATELETS 324 140 - 440 x10^3/uL    MPV 9.5 7.9 - 10.8 fL    NEUTROPHIL % 73 43 - 77 %    LYMPHOCYTE % 18 16 - 44 %    MONOCYTE % 6 5 - 13 %    EOSINOPHIL % 2 %    BASOPHIL % 1 0 - 1 %    NEUTROPHIL # 15.70 (H) 1.85 - 7.80 x10^3/uL    LYMPHOCYTE # 3.90 (H) 1.00 - 3.00 x10^3/uL    MONOCYTE # 1.40 (H) 0.30 - 1.00 x10^3/uL    EOSINOPHIL # 0.30 0.00 - 0.50 x10^3/uL    BASOPHIL # 0.10 0.00 - 0.10 x10^3/uL     US TRANSVAGINAL OB  [id number]    (Results Pending)

## 2023-01-01 ENCOUNTER — Other Ambulatory Visit (HOSPITAL_COMMUNITY): Payer: Self-pay | Admitting: Nurse Practitioner

## 2023-01-01 LAB — URINE CULTURE,ROUTINE: URINE CULTURE: >100000 — AB

## 2023-01-01 MED ORDER — NITROFURANTOIN MONOHYDRATE/MACROCRYSTALS 100 MG CAPSULE
100.0000 mg | ORAL_CAPSULE | Freq: Two times a day (BID) | ORAL | 0 refills | Status: AC
Start: 2023-01-01 — End: 2023-01-08

## 2023-01-01 NOTE — Result Encounter Note (Signed)
Attempted to call pt, unable to leave voiceamil.

## 2023-01-01 NOTE — Result Encounter Note (Signed)
Pt notified of results

## 2023-01-21 ENCOUNTER — Other Ambulatory Visit: Payer: Self-pay

## 2023-01-21 ENCOUNTER — Emergency Department
Admission: EM | Admit: 2023-01-21 | Discharge: 2023-01-22 | Disposition: A | Discharge status: Home / Self Care | Payer: MEDICAID | Attending: NURSE PRACTITIONER | Admitting: NURSE PRACTITIONER

## 2023-01-21 DIAGNOSIS — Z3201 Encounter for pregnancy test, result positive: Secondary | ICD-10-CM | POA: Insufficient documentation

## 2023-01-21 DIAGNOSIS — O219 Vomiting of pregnancy, unspecified: Secondary | ICD-10-CM | POA: Insufficient documentation

## 2023-01-21 DIAGNOSIS — Z3A09 9 weeks gestation of pregnancy: Secondary | ICD-10-CM | POA: Insufficient documentation

## 2023-01-21 DIAGNOSIS — O26891 Other specified pregnancy related conditions, first trimester: Secondary | ICD-10-CM | POA: Insufficient documentation

## 2023-01-21 DIAGNOSIS — R112 Nausea with vomiting, unspecified: Secondary | ICD-10-CM

## 2023-01-21 DIAGNOSIS — Z3490 Encounter for supervision of normal pregnancy, unspecified, unspecified trimester: Secondary | ICD-10-CM

## 2023-01-21 DIAGNOSIS — R6889 Other general symptoms and signs: Secondary | ICD-10-CM | POA: Insufficient documentation

## 2023-01-21 DIAGNOSIS — H538 Other visual disturbances: Secondary | ICD-10-CM | POA: Insufficient documentation

## 2023-01-21 DIAGNOSIS — R519 Headache, unspecified: Secondary | ICD-10-CM | POA: Insufficient documentation

## 2023-01-21 DIAGNOSIS — R5383 Other fatigue: Secondary | ICD-10-CM

## 2023-01-21 LAB — URINALYSIS, MACROSCOPIC
BILIRUBIN: NEGATIVE mg/dL
BLOOD: NEGATIVE mg/dL
GLUCOSE: NEGATIVE mg/dL
KETONES: NEGATIVE mg/dL
LEUKOCYTES: NEGATIVE WBCs/uL
PH: 6.5 (ref 5.0–9.0)
PROTEIN: NEGATIVE mg/dL
SPECIFIC GRAVITY: 1.006 (ref 1.002–1.030)
UROBILINOGEN: NORMAL mg/dL

## 2023-01-21 LAB — LIPASE: LIPASE: 31 U/L (ref 11–82)

## 2023-01-21 LAB — URINALYSIS, MICROSCOPIC
RBCS: 1 /hpf (ref <4)
SQUAMOUS EPITHELIAL: 4 /hpf (ref <28)
WBCS: 1 /hpf (ref <6)

## 2023-01-21 LAB — CBC WITH DIFF
BASOPHIL #: 0.1 10*3/uL (ref 0.00–0.10)
BASOPHIL %: 1 % (ref 0–1)
EOSINOPHIL #: 0.3 10*3/uL (ref 0.00–0.50)
EOSINOPHIL %: 3 %
HCT: 31.3 % (ref 31.2–41.9)
HGB: 10.1 g/dL — ABNORMAL LOW (ref 10.9–14.3)
LYMPHOCYTE #: 3.1 10*3/uL — ABNORMAL HIGH (ref 1.00–3.00)
LYMPHOCYTE %: 29 % (ref 16–44)
MCH: 24.3 pg — ABNORMAL LOW (ref 24.7–32.8)
MCHC: 32.2 g/dL — ABNORMAL LOW (ref 32.3–35.6)
MCV: 75.5 fL (ref 75.5–95.3)
MONOCYTE #: 0.8 10*3/uL (ref 0.30–1.00)
MONOCYTE %: 7 % (ref 5–13)
MPV: 9.6 fL (ref 7.9–10.8)
NEUTROPHIL #: 6.4 10*3/uL (ref 1.85–7.80)
NEUTROPHIL %: 60 % (ref 43–77)
PLATELETS: 266 10*3/uL (ref 140–440)
RBC: 4.15 10*6/uL (ref 3.63–4.92)
RDW: 17.5 % (ref 12.3–17.7)
WBC: 10.7 10*3/uL (ref 3.8–11.8)

## 2023-01-21 LAB — COMPREHENSIVE METABOLIC PANEL, NON-FASTING
ALBUMIN/GLOBULIN RATIO: 1.7 — ABNORMAL HIGH (ref 0.8–1.4)
ALBUMIN: 4.7 g/dL (ref 3.5–5.7)
ALKALINE PHOSPHATASE: 65 U/L (ref 34–104)
ALT (SGPT): <7 U/L — ABNORMAL LOW (ref 7–52)
ANION GAP: 9 mmol/L (ref 4–13)
AST (SGOT): 16 U/L (ref 13–39)
BILIRUBIN TOTAL: 0.3 mg/dL (ref 0.3–1.2)
BUN/CREA RATIO: 16 (ref 6–22)
BUN: 8 mg/dL (ref 7–25)
CALCIUM, CORRECTED: 8.7 mg/dL — ABNORMAL LOW (ref 8.9–10.8)
CALCIUM: 9.3 mg/dL (ref 8.6–10.3)
CHLORIDE: 104 mmol/L (ref 98–107)
CO2 TOTAL: 23 mmol/L (ref 21–31)
CREATININE: 0.5 mg/dL — ABNORMAL LOW (ref 0.60–1.30)
ESTIMATED GFR: 129 mL/min/{1.73_m2} (ref >59)
GLOBULIN: 2.8 — ABNORMAL LOW (ref 2.9–5.4)
GLUCOSE: 82 mg/dL (ref 74–109)
OSMOLALITY, CALCULATED: 269 mOsm/kg — ABNORMAL LOW (ref 270–290)
POTASSIUM: 4 mmol/L (ref 3.5–5.1)
PROTEIN TOTAL: 7.5 g/dL (ref 6.4–8.9)
SODIUM: 136 mmol/L (ref 136–145)

## 2023-01-21 LAB — HCG, PLASMA OR SERUM QUANTITATIVE, PREGNANCY: HCG, QUANTITATIVE: 191985 m[IU]/mL — ABNORMAL HIGH (ref <=3.00)

## 2023-01-21 LAB — POC BLOOD GLUCOSE (RESULTS): GLUCOSE, POC: 89 mg/dl (ref 70–100)

## 2023-01-21 MED ORDER — SODIUM CHLORIDE 0.9 % IV BOLUS
1000.0000 mL | INJECTION | Status: AC
Start: 2023-01-21 — End: 2023-01-21
  Administered 2023-01-21: 1000 mL via INTRAVENOUS
  Administered 2023-01-21: 0 mL via INTRAVENOUS

## 2023-01-21 MED ORDER — ONDANSETRON HCL (PF) 4 MG/2 ML INJECTION SOLUTION
INTRAMUSCULAR | Status: AC
Start: 2023-01-21 — End: 2023-01-21
  Filled 2023-01-21: qty 2

## 2023-01-21 MED ORDER — ONDANSETRON HCL (PF) 4 MG/2 ML INJECTION SOLUTION
4.0000 mg | INTRAMUSCULAR | Status: AC
Start: 2023-01-21 — End: 2023-01-21
  Administered 2023-01-21: 4 mg via INTRAVENOUS

## 2023-01-21 MED ORDER — ONDANSETRON 4 MG DISINTEGRATING TABLET
4.0000 mg | ORAL_TABLET | Freq: Three times a day (TID) | ORAL | 0 refills | Status: AC | PRN
Start: 2023-01-21 — End: ?

## 2023-01-21 NOTE — ED Provider Notes (Signed)
[facility]  Emergency Department  Advanced Practice Provider Note      CHIEF COMPLAINT  Chief Complaint   Patient presents with    Hyperglycemia     HISTORY OF PRESENT ILLNESS  SHONELL DUTCHOVER, date of birth 28-Nov-1990, is a 32 y.o. female who presented to the Emergency Department.    Patient is a 32 year old female, approximately [redacted] weeks pregnant, who reports to the ER with complaint of high blood sugar and nausea and vomiting.  Reports she has been feeling ill for the last 2 days.  Was at her mother-in-law's house when they checked her sugar and it was over 300.  Patient also reporting some nausea and vomiting the last few days.  States she has only been able to eat fruit.  Patient reports she last vomited just prior to ER arrival.  Patient also complaining of some headache and blurry vision.  Denies any history of blood pressure problems.  Denies any history of preeclampsia.  She is a G7P5.  Follows up with OBGYN Dr. Vida Rigger.  Patient is due on December 28th.  Patient denies any abdominal pain or vaginal bleeding.  Patient denies any recent fever, chills or body aches.  Denies any dizziness or syncope.  Denies any seizure activity.  Denies any chest pain or palpitations.  Denies any dyspnea denies any dysuria or hematuria.        PAST MEDICAL/SURGICAL/FAMILY/SOCIAL HISTORY  No past medical history on file.      Family Medical History:    None       Social History     Socioeconomic History    Marital status: Single   Tobacco Use    Smoking status: Every Day     Current packs/day: 0.50     Types: Cigarettes    Smokeless tobacco: Never   Substance and Sexual Activity    Alcohol use: Never    Drug use: Never      ALLERGIES  Allergies   Allergen Reactions    Clindamycin     Keflex [Cephalexin]     Metronidazole        PHYSICAL EXAM  VITAL SIGNS:  Filed Vitals:    01/21/23 2101   BP: 110/68   Pulse: 84   Resp: 18   Temp: 37.1 C (98.7 F)   SpO2: 100%     Constitutional: Awake. Well appearing.  Average body weight. No distress noted.   Cardiovascular: Regular rate. S1, S2 with no murmur or gallop heard. No swelling to extremities  Pulmonary/Chest: Breath sounds clear and equal bilaterally. No wheezes, rales or chest tenderness. No respiratory distress.   Abdominal: Bowel sound normal. Abdomen soft, no tenderness, rebound or guarding.        Musculoskeletal: No tenderness or deformity. Normal muscle tone and strength.   Skin: warm and dry. No rash, redness, or bruising  Psychiatric: normal mood and affect. Behavior is normal.   Neurological: Alert, oriented. Normal gait. No focal weakness noted. No sensory deficit    Nursing notes reviewed.     DIAGNOSTICS  Labs:  Labs listed below were reviewed and interpreted by me.  Results for orders placed or performed during the hospital encounter of 01/21/23   COMPREHENSIVE METABOLIC PANEL, NON-FASTING   Result Value Ref Range    SODIUM 136 136 - 145 mmol/L    POTASSIUM 4.0 3.5 - 5.1 mmol/L    CHLORIDE 104 98 - 107 mmol/L    CO2 TOTAL 23 21 - 31 mmol/L  ANION GAP 9 4 - 13 mmol/L    BUN 8 7 - 25 mg/dL    CREATININE 5.40 (L) 0.60 - 1.30 mg/dL    BUN/CREA RATIO 16 6 - 22    ESTIMATED GFR 129 >59 mL/min/1.4m^2    ALBUMIN 4.7 3.5 - 5.7 g/dL    CALCIUM 9.3 8.6 - 98.1 mg/dL    GLUCOSE 82 74 - 191 mg/dL    ALKALINE PHOSPHATASE 65 34 - 104 U/L    ALT (SGPT) <7 (L) 7 - 52 U/L    AST (SGOT) 16 13 - 39 U/L    BILIRUBIN TOTAL 0.3 0.3 - 1.2 mg/dL    PROTEIN TOTAL 7.5 6.4 - 8.9 g/dL    ALBUMIN/GLOBULIN RATIO 1.7 (H) 0.8 - 1.4    OSMOLALITY, CALCULATED 269 (L) 270 - 290 mOsm/kg    CALCIUM, CORRECTED 8.7 (L) 8.9 - 10.8 mg/dL    GLOBULIN 2.8 (L) 2.9 - 5.4   HCG, PLASMA OR SERUM QUANTITATIVE, PREGNANCY   Result Value Ref Range    HCG, QUANTITATIVE 191,985.00 (H) <=3.00 mIU/mL   CBC WITH DIFF   Result Value Ref Range    WBC 10.7 3.8 - 11.8 x10^3/uL    RBC 4.15 3.63 - 4.92 x10^6/uL    HGB 10.1 (L) 10.9 - 14.3 g/dL    HCT 47.8 29.5 - 62.1 %    MCV 75.5 75.5 - 95.3 fL    MCH 24.3 (L)  24.7 - 32.8 pg    MCHC 32.2 (L) 32.3 - 35.6 g/dL    RDW 30.8 65.7 - 84.6 %    PLATELETS 266 140 - 440 x10^3/uL    MPV 9.6 7.9 - 10.8 fL    NEUTROPHIL % 60 43 - 77 %    LYMPHOCYTE % 29 16 - 44 %    MONOCYTE % 7 5 - 13 %    EOSINOPHIL % 3 %    BASOPHIL % 1 0 - 1 %    NEUTROPHIL # 6.40 1.85 - 7.80 x10^3/uL    LYMPHOCYTE # 3.10 (H) 1.00 - 3.00 x10^3/uL    MONOCYTE # 0.80 0.30 - 1.00 x10^3/uL    EOSINOPHIL # 0.30 0.00 - 0.50 x10^3/uL    BASOPHIL # 0.10 0.00 - 0.10 x10^3/uL   URINALYSIS, MACROSCOPIC   Result Value Ref Range    COLOR Light Yellow Colorless, Light Yellow, Yellow    APPEARANCE Clear Clear    SPECIFIC GRAVITY 1.006 1.002 - 1.030    PH 6.5 5.0 - 9.0    LEUKOCYTES Negative Negative, 100  WBCs/uL    NITRITE 2+ (A) Negative    PROTEIN Negative Negative, 10 , 20  mg/dL    GLUCOSE Negative Negative, 30  mg/dL    KETONES Negative Negative, Trace mg/dL    BILIRUBIN Negative Negative, 0.5 mg/dL    BLOOD Negative Negative, 0.03 mg/dL    UROBILINOGEN Normal Normal mg/dL   URINALYSIS, MICROSCOPIC   Result Value Ref Range    BACTERIA Rare (A) Negative /hpf    RBCS 1 <4 /hpf    WBCS 1 <6 /hpf    SQUAMOUS EPITHELIAL 4 <28 /hpf   LIPASE   Result Value Ref Range    LIPASE 31 11 - 82 U/L   POC BLOOD GLUCOSE (RESULTS)   Result Value Ref Range    GLUCOSE, POC 89 70 - 100 mg/dl     Radiology:       ED COURSE/MEDICAL DECISION MAKING  Medications Administered in the ED  NS bolus infusion 1,000 mL (1,000 mL Intravenous New Bag/New Syringe 01/21/23 2222)   ondansetron (ZOFRAN) 2 mg/mL injection (4 mg Intravenous Given 01/21/23 2222)      ED Course as of 01/21/23 2304   Thu Jan 21, 2023   2301 WBC: 10.7  Normal. PMNs 60   2302 HGB(!): 10.1  Hct 31.3. Similar to previous labs   2302 PLATELET COUNT: 266  Normal   2302 COMPREHENSIVE METABOLIC PANEL, NON-FASTING(!)  Electrolytes normal   2302 CREATININE(!): 0.50  GFR 129 BUN 8   2302 BILIRUBIN, TOTAL: 0.3  Normal. ALT <7 AST 16 Alk Phos 65   2303 LIPASE: 31  Normal   2303 HCG,  QUANTITATIVE(!): 191,985.00  3 weeks ago- 2,600   2303 LEUKOCYTES: Negative  Nitrite 2+ WBC's 1   2303 SPECIFIC GRAVITY, URINE: 1.006  Protein negative. Ketones negative      Medical Decision Making  Patient is a 32 year old female, reports being non pregnant, presents the ER with complaint of general ill feeling for the last few days.  Reports she is having some body aches, headache, nausea and vomiting.  Patient's OBGYN is Dr. Vida Rigger.  Patient is a Social research officer, government.  Patient denies any abdominal pain or vaginal bleeding.  Patient's physical exam is unremarkable.    List of differential diagnosis includes but is not limited to hyperemesis, gastritis, gastroenteritis, influenza, urinary tract infection, sepsis, preeclampsia, gestational diabetes, hypoglycemia    Patient's white count is normal at 10.7 and neutrophils 60.  Patient's platelet count is 266.  Patient's hemoglobin and hematocrit are slightly low at 10.1 and 31.3 but this is similar to previous labs.  Patient's electrolytes are normal.  Patient's renal function shows creatinine 0.50, GFR 129 and BUN 8.  Patient's liver enzymes show total bilirubin 0.3, ALT less than 7, AST 16 and alk phos 65.  Patient's lipase is normal.  Patient's HCG quantitative is 191,000, this is significantly increased since 3 weeks ago when hCG was 2600.  Patient's urinalysis does not show evidence of a urinary tract infection or dehydration.    Patient given 1 L normal saline  IV.  Patient states she is feeling much better.  Strongly encouraged patient to keep a log of her daily blood sugars and daily blood pressures.  Patient has a follow-up appointment with OBGYN next week.  Patient given strict return to ED precautions    Following the above history, physical exam, and findings- the patient was deemed stable and suitable for discharge.  Discharge and medication instructions were discussed with the patient and all questions were addressed.  The patient understands that they may return  to the ED at any time for new or worsening symptoms or if they have any other concerns.  The patient is to follow up with OBGYN . Patient verbalized understanding and agrees to plan of care       Amount and/or Complexity of Data Reviewed  ECG/medicine tests: independent interpretation performed.    Risk  Prescription drug management.      CLINICAL IMPRESSION  Clinical Impression   Pregnancy, unspecified gestational age (Primary)   Nausea & vomiting   Fatigue, unspecified type   General ill feeling     DISPOSITION  Discharged       DISCHARGE MEDICATIONS  Discharge Medication List as of 01/21/2023 11:58 PM        START taking these medications    Details   ondansetron (ZOFRAN ODT) 4 mg Oral Tablet, Rapid Dissolve Take 1 Tablet (4  mg total) by mouth Every 8 hours as needed for Nausea/Vomiting, Disp-12 Tablet, R-0, E-Rx             Sherlie Ban, FNP-C 01/21/2023, 22:37   [facility]  Department of Emergency Medicine  [facility]    This note was partially generated using MModal Fluency Direct system, and there may be some incorrect words, spellings, and punctuation that were not noted in checking the note before saving.    -----

## 2023-01-21 NOTE — ED Triage Notes (Signed)
Increased blood sugar of 316 at 2020, no hx of DM, about nine weeks pregnant, blood sugar in triage is 89, G-7, P-5, A-1

## 2023-01-21 NOTE — Discharge Instructions (Signed)
Continue to monitor symptoms.  Take Zofran as needed for nausea.  Eat small bland meals for the next few days.  Make sure you are staying hydrated with plenty of fluids.  Take Tylenol as needed for headache.  Follow-up with Dr. Dyanne Iha office as scheduled.  Continue to monitor your blood sugar and your blood pressure.  Return to ER immediately for any new or worsening symptoms as discussed

## 2023-01-22 MED ORDER — ONDANSETRON 4 MG DISINTEGRATING TABLET
ORAL_TABLET | ORAL | Status: AC
Start: 2023-01-22 — End: 2023-01-22
  Filled 2023-01-22: qty 2

## 2023-01-22 MED ORDER — ONDANSETRON 4 MG DISINTEGRATING TABLET
8.0000 mg | ORAL_TABLET | ORAL | Status: AC
Start: 2023-01-22 — End: 2023-01-22
  Administered 2023-01-22: 8 mg via ORAL

## 2023-01-22 NOTE — ED Nurses Note (Signed)
Pt dc'd with verb understanding and return prn any needs, ambulated out of er without diff

## 2023-01-24 LAB — URINE CULTURE,ROUTINE: URINE CULTURE: >100000 — AB

## 2023-01-25 ENCOUNTER — Other Ambulatory Visit (HOSPITAL_COMMUNITY): Payer: Self-pay | Admitting: Physician Assistant

## 2023-01-25 MED ORDER — AMOXICILLIN 500 MG CAPSULE
500.0000 mg | ORAL_CAPSULE | Freq: Three times a day (TID) | ORAL | 0 refills | Status: AC
Start: 2023-01-25 — End: 2023-02-01

## 2023-01-26 NOTE — Result Encounter Note (Signed)
Pt notified of abnormal culture and  new RX

## 2023-02-04 LAB — SYPHILIS SCREENING ALGORITHM WITH REFLEX, SERUM: SYPHILIS TP ANTIBODIES: NONREACTIVE

## 2023-02-04 LAB — RUBELLA VIRUS ANTIBODIES, IGG, SERUM: RUBELLA IGG QUALITATIVE: IMMUNE

## 2023-02-04 LAB — HEPATITIS B SURFACE ANTIGEN: HEPATITIS B SURFACE AG: NEGATIVE

## 2023-02-05 LAB — HIV1/HIV2 SCREEN, COMBINED ANTIGEN AND ANTIBODY: HIV SCREEN, COMBINED ANTIGEN & ANTIBODY: NONREACTIVE

## 2023-05-25 LAB — SYPHILIS SCREENING ALGORITHM WITH REFLEX, SERUM: SYPHILIS TP ANTIBODIES: NONREACTIVE

## 2023-05-27 ENCOUNTER — Encounter (HOSPITAL_COMMUNITY): Payer: Self-pay | Admitting: Student in an Organized Health Care Education/Training Program

## 2023-05-27 ENCOUNTER — Other Ambulatory Visit (HOSPITAL_COMMUNITY): Payer: Self-pay | Admitting: Student in an Organized Health Care Education/Training Program

## 2023-05-27 DIAGNOSIS — Z3A27 27 weeks gestation of pregnancy: Secondary | ICD-10-CM | POA: Insufficient documentation

## 2023-05-27 DIAGNOSIS — D509 Iron deficiency anemia, unspecified: Secondary | ICD-10-CM | POA: Insufficient documentation

## 2023-05-30 ENCOUNTER — Encounter (HOSPITAL_COMMUNITY): Payer: Self-pay | Admitting: Student in an Organized Health Care Education/Training Program

## 2023-05-31 ENCOUNTER — Ambulatory Visit
Admission: RE | Admit: 2023-05-31 | Discharge: 2023-05-31 | Disposition: A | Discharge status: Home / Self Care | Payer: MEDICAID | Source: Ambulatory Visit | Attending: Obstetrics & Gynecology | Admitting: Obstetrics & Gynecology

## 2023-05-31 ENCOUNTER — Encounter (HOSPITAL_COMMUNITY): Payer: Self-pay | Admitting: Student in an Organized Health Care Education/Training Program

## 2023-05-31 ENCOUNTER — Other Ambulatory Visit: Payer: Self-pay

## 2023-05-31 VITALS — BP 99/51 | HR 80 | Temp 97.7°F | Resp 20

## 2023-05-31 DIAGNOSIS — Z3A27 27 weeks gestation of pregnancy: Secondary | ICD-10-CM | POA: Insufficient documentation

## 2023-05-31 DIAGNOSIS — D509 Iron deficiency anemia, unspecified: Secondary | ICD-10-CM

## 2023-05-31 DIAGNOSIS — O99012 Anemia complicating pregnancy, second trimester: Secondary | ICD-10-CM | POA: Insufficient documentation

## 2023-05-31 MED ORDER — ALBUTEROL SULFATE 2.5 MG/3 ML (0.083 %) SOLUTION FOR NEBULIZATION
2.5000 mg | INHALATION_SOLUTION | Freq: Once | RESPIRATORY_TRACT | Status: DC | PRN
Start: 2023-05-31 — End: 2023-06-01

## 2023-05-31 MED ORDER — EPINEPHRINE 1 MG/ML (1 ML) INJECTION SOLUTION
0.3000 mg | Freq: Once | INTRAMUSCULAR | Status: DC | PRN
Start: 2023-05-31 — End: 2023-06-01

## 2023-05-31 MED ORDER — LORATADINE 10 MG TABLET
10.0000 mg | ORAL_TABLET | Freq: Once | ORAL | Status: DC | PRN
Start: 2023-05-31 — End: 2023-06-01

## 2023-05-31 MED ORDER — HYDROCORTISONE SOD SUCCINATE 100 MG/2 ML VIAL WRAPPER
100.0000 mg | Freq: Once | INTRAMUSCULAR | Status: DC | PRN
Start: 2023-05-31 — End: 2023-06-01

## 2023-05-31 MED ORDER — ALBUTEROL SULFATE HFA 90 MCG/ACTUATION AEROSOL INHALER - RN
2.0000 | Freq: Once | RESPIRATORY_TRACT | Status: DC | PRN
Start: 2023-05-31 — End: 2023-06-01

## 2023-05-31 MED ORDER — FAMOTIDINE (PF) 20 MG/2 ML INTRAVENOUS SOLUTION
20.0000 mg | Freq: Once | INTRAVENOUS | Status: DC | PRN
Start: 2023-05-31 — End: 2023-06-01

## 2023-05-31 MED ORDER — ACETAMINOPHEN 325 MG TABLET
975.0000 mg | ORAL_TABLET | Freq: Once | ORAL | Status: DC | PRN
Start: 2023-05-31 — End: 2023-06-01

## 2023-05-31 MED ORDER — SODIUM CHLORIDE 0.9 % IV BOLUS
500.0000 mL | INJECTION | Freq: Once | Status: DC | PRN
Start: 2023-05-31 — End: 2023-06-01

## 2023-05-31 MED ORDER — SODIUM CHLORIDE 0.9 % INTRAVENOUS SOLUTION
200.0000 mg | Freq: Once | INTRAVENOUS | Status: AC
Start: 2023-05-31 — End: 2023-05-31
  Administered 2023-05-31: 0 mg via INTRAVENOUS
  Administered 2023-05-31: 200 mg via INTRAVENOUS
  Filled 2023-05-31: qty 10

## 2023-05-31 NOTE — Nurses Notes (Signed)
Summary: pt arrived an received iv iron infusion with no reactions or distress noted during or after treatment.assessment WNL.infusion complete,iv removed with pressure dressing applied.down via walking at DC no concerns voiced upon leaving floor.Candace Ramus, LPN

## 2023-06-01 ENCOUNTER — Encounter (HOSPITAL_COMMUNITY): Payer: Self-pay | Admitting: Student in an Organized Health Care Education/Training Program

## 2023-06-04 ENCOUNTER — Encounter (HOSPITAL_COMMUNITY): Payer: Self-pay | Admitting: Student in an Organized Health Care Education/Training Program

## 2023-06-07 ENCOUNTER — Ambulatory Visit (HOSPITAL_COMMUNITY): Payer: Self-pay

## 2023-06-14 ENCOUNTER — Ambulatory Visit (HOSPITAL_COMMUNITY): Payer: Self-pay

## 2023-06-21 ENCOUNTER — Ambulatory Visit (HOSPITAL_COMMUNITY): Payer: Self-pay

## 2023-08-05 LAB — CHLAMYDIA TRACHOMATIS/NEISSERIA GONORRHOEAE RNA, NAAT
CHLAMYDIA TRACHOMATIS RNA: NOT DETECTED
NEISSERIA GONORRHEA GC RNA: NOT DETECTED

## 2023-08-07 LAB — GROUP B STREPTOCOCCUS DNA BY NAAT: GROUP B STREPTOCOCCUS (GBS) DNA BY NAAT: NEGATIVE

## 2023-08-08 ENCOUNTER — Other Ambulatory Visit: Payer: Self-pay

## 2023-08-08 ENCOUNTER — Encounter (HOSPITAL_COMMUNITY): Payer: Self-pay | Admitting: Student in an Organized Health Care Education/Training Program

## 2023-08-08 ENCOUNTER — Encounter (HOSPITAL_COMMUNITY): Payer: Self-pay | Admitting: Anesthesiology

## 2023-08-08 ENCOUNTER — Inpatient Hospital Stay (HOSPITAL_COMMUNITY): Payer: MEDICAID | Admitting: Student in an Organized Health Care Education/Training Program

## 2023-08-08 ENCOUNTER — Inpatient Hospital Stay
Admission: RE | Admit: 2023-08-08 | Discharge: 2023-08-10 | DRG: 768 | Disposition: A | Discharge status: Home / Self Care | Payer: MEDICAID | Source: Ambulatory Visit | Attending: Student in an Organized Health Care Education/Training Program | Admitting: Student in an Organized Health Care Education/Training Program

## 2023-08-08 ENCOUNTER — Inpatient Hospital Stay (HOSPITAL_COMMUNITY)
Admission: RE | Admit: 2023-08-08 | Discharge: 2023-08-08 | Discharge status: Home / Self Care | Payer: Self-pay | Source: Ambulatory Visit | Attending: Obstetrics & Gynecology

## 2023-08-08 DIAGNOSIS — O99324 Drug use complicating childbirth: Secondary | ICD-10-CM | POA: Diagnosis present

## 2023-08-08 DIAGNOSIS — O365990 Maternal care for other known or suspected poor fetal growth, unspecified trimester, not applicable or unspecified: Secondary | ICD-10-CM | POA: Insufficient documentation

## 2023-08-08 DIAGNOSIS — O365930 Maternal care for other known or suspected poor fetal growth, third trimester, not applicable or unspecified: Principal | ICD-10-CM | POA: Diagnosis present

## 2023-08-08 DIAGNOSIS — Z3A37 37 weeks gestation of pregnancy: Secondary | ICD-10-CM

## 2023-08-08 DIAGNOSIS — F1290 Cannabis use, unspecified, uncomplicated: Secondary | ICD-10-CM | POA: Diagnosis present

## 2023-08-08 DIAGNOSIS — Z87891 Personal history of nicotine dependence: Secondary | ICD-10-CM

## 2023-08-08 LAB — CBC
HCT: 27.7 % — ABNORMAL LOW (ref 31.2–41.9)
HGB: 8.8 g/dL — ABNORMAL LOW (ref 10.9–14.3)
MCH: 21.6 pg — ABNORMAL LOW (ref 24.7–32.8)
MCHC: 31.6 g/dL — ABNORMAL LOW (ref 32.3–35.6)
MCV: 68.2 fL — ABNORMAL LOW (ref 75.5–95.3)
MPV: 10.2 fL (ref 7.9–10.8)
PLATELETS: 174 10*3/uL (ref 140–440)
RBC: 4.06 10*6/uL (ref 3.63–4.92)
RDW: 20.4 % — ABNORMAL HIGH (ref 12.3–17.7)
WBC: 15.9 10*3/uL — ABNORMAL HIGH (ref 3.8–11.8)

## 2023-08-08 LAB — URINALYSIS, MACROSCOPIC
BILIRUBIN: NEGATIVE mg/dL
BLOOD: NEGATIVE mg/dL
GLUCOSE: NEGATIVE mg/dL
LEUKOCYTES: 25 WBCs/uL — AB
NITRITE: NEGATIVE
PH: 6 (ref 5.0–9.0)
PROTEIN: 20 mg/dL
SPECIFIC GRAVITY: 1.031 — ABNORMAL HIGH (ref 1.002–1.030)
UROBILINOGEN: NORMAL mg/dL

## 2023-08-08 LAB — DRUG SCREEN, NO CONFIRMATION, URINE
AMPHETAMINES URINE: NEGATIVE
BARBITURATES URINE: NEGATIVE
BENZODIAZEPINES URINE: NEGATIVE
BUPRENORPHINE URINE: NEGATIVE
CANNABINOIDS URINE: NEGATIVE
COCAINE METABOLITES URINE: NEGATIVE
FENTANYL, URINE: POSITIVE — AB
METHADONE URINE: NEGATIVE
OPIATES URINE: NEGATIVE
OXYCODONE URINE: NEGATIVE
PCP URINE: NEGATIVE

## 2023-08-08 LAB — TYPE AND SCREEN
ABO/RH(D): O POS
ANTIBODY SCREEN: NEGATIVE

## 2023-08-08 LAB — SCAN DIFFERENTIAL: PLATELET MORPHOLOGY COMMENT: NORMAL

## 2023-08-08 LAB — URINALYSIS, MICROSCOPIC
RBCS: 2 /[HPF] (ref <4)
SQUAMOUS EPITHELIAL: 3 /[HPF] (ref <28)
WBCS: 1 /[HPF] (ref <6)

## 2023-08-08 MED ORDER — FENTANYL (PF) 50 MCG/ML INJECTION SOLUTION
INTRAMUSCULAR | Status: AC
Start: 2023-08-08 — End: 2023-08-08
  Filled 2023-08-08: qty 2

## 2023-08-08 MED ORDER — LACTATED RINGERS INTRAVENOUS SOLUTION
INTRAVENOUS | Status: DC
Start: 2023-08-08 — End: 2023-08-09
  Filled 2023-08-08: qty 1000

## 2023-08-08 MED ORDER — SODIUM CHLORIDE 0.9 % (FLUSH) INJECTION SYRINGE
3.0000 mL | INJECTION | Freq: Three times a day (TID) | INTRAMUSCULAR | Status: DC
Start: 2023-08-08 — End: 2023-08-08
  Administered 2023-08-08 (×2): 0 mL

## 2023-08-08 MED ORDER — MEASLES,MUMPS,RUBELLA VACCINE LIVE(PF)1,000-12,500TCID50/0.5 ML SUBCUT
0.5000 mL | Freq: Once | SUBCUTANEOUS | Status: DC | PRN
Start: 2023-08-08 — End: 2023-08-10

## 2023-08-08 MED ORDER — OXYTOCIN 10 UNIT/ML INJECTION SOLUTION
INTRAVENOUS | Status: DC
Start: 2023-08-08 — End: 2023-08-08
  Filled 2023-08-08: qty 3

## 2023-08-08 MED ORDER — PHENYLEPHRINE 100 MCG/ML IV DILUTION - FOR ANES
100.0000 ug | INJECTION | Freq: Once | INTRAVENOUS | Status: DC
Start: 2023-08-08 — End: 2023-08-08

## 2023-08-08 MED ORDER — ONDANSETRON HCL (PF) 4 MG/2 ML INJECTION SOLUTION
4.0000 mg | Freq: Three times a day (TID) | INTRAMUSCULAR | Status: DC | PRN
Start: 2023-08-08 — End: 2023-08-08

## 2023-08-08 MED ORDER — ONDANSETRON HCL (PF) 4 MG/2 ML INJECTION SOLUTION
4.0000 mg | INTRAMUSCULAR | Status: DC | PRN
Start: 2023-08-08 — End: 2023-08-08

## 2023-08-08 MED ORDER — ROPIVACAINE (PF) 2 MG/ML (0.2 %) INJECTION SOLUTION
Freq: Once | INTRAMUSCULAR | Status: DC | PRN
Start: 2023-08-08 — End: 2023-08-08
  Administered 2023-08-08 (×2): 4 mL via EPIDURAL

## 2023-08-08 MED ORDER — NALOXONE 0.4 MG/ML INJECTION SOLUTION
0.2000 mg | INTRAMUSCULAR | Status: DC | PRN
Start: 2023-08-08 — End: 2023-08-08

## 2023-08-08 MED ORDER — LIDOCAINE HCL 10 MG/ML (1 %) INJECTION SOLUTION
10.0000 mL | Freq: Once | INTRAMUSCULAR | Status: DC | PRN
Start: 2023-08-08 — End: 2023-08-08

## 2023-08-08 MED ORDER — FENTANYL (PF) 50 MCG/ML INJECTION WRAPPER
100.0000 ug | INJECTION | Freq: Once | INTRAMUSCULAR | Status: AC
Start: 2023-08-08 — End: 2023-08-08
  Administered 2023-08-08 (×2): 50 ug via EPIDURAL

## 2023-08-08 MED ORDER — BUTORPHANOL 2 MG/ML INJECTION SOLUTION
0.5000 mg | Freq: Once | INTRAMUSCULAR | Status: DC
Start: 2023-08-08 — End: 2023-08-08

## 2023-08-08 MED ORDER — ACETAMINOPHEN 325 MG TABLET
1000.0000 mg | ORAL_TABLET | Freq: Four times a day (QID) | ORAL | Status: DC | PRN
Start: 2023-08-08 — End: 2023-08-10
  Administered 2023-08-09 – 2023-08-10 (×2): 975 mg via ORAL
  Filled 2023-08-08 (×3): qty 3

## 2023-08-08 MED ORDER — CARBOPROST TROMETHAMINE 250 MCG/ML INTRAMUSCULAR SOLUTION
250.0000 ug | Freq: Once | INTRAMUSCULAR | Status: DC | PRN
Start: 2023-08-08 — End: 2023-08-09

## 2023-08-08 MED ORDER — SODIUM CHLORIDE 0.9 % (FLUSH) INJECTION SYRINGE
3.0000 mL | INJECTION | Freq: Three times a day (TID) | INTRAMUSCULAR | Status: DC
Start: 2023-08-08 — End: 2023-08-09
  Filled 2023-08-08: qty 10

## 2023-08-08 MED ORDER — MISOPROSTOL 100 MCG TABLET
1000.0000 ug | ORAL_TABLET | Freq: Once | ORAL | Status: DC | PRN
Start: 2023-08-08 — End: 2023-08-09

## 2023-08-08 MED ORDER — MISOPROSTOL 25 MCG QUARTER TABLET
25.0000 ug | ORAL_TABLET | Freq: Once | ORAL | Status: AC
Start: 2023-08-08 — End: 2023-08-08
  Administered 2023-08-08: 25 ug via VAGINAL
  Filled 2023-08-08: qty 1

## 2023-08-08 MED ORDER — EPHEDRINE SULFATE 50 MG/ML INTRAVENOUS SOLUTION
10.0000 mg | Freq: Once | INTRAVENOUS | Status: DC | PRN
Start: 2023-08-08 — End: 2023-08-08

## 2023-08-08 MED ORDER — IBUPROFEN 600 MG TABLET
600.0000 mg | ORAL_TABLET | Freq: Four times a day (QID) | ORAL | Status: DC | PRN
Start: 2023-08-08 — End: 2023-08-10
  Administered 2023-08-08 – 2023-08-10 (×4): 600 mg via ORAL
  Filled 2023-08-08 (×4): qty 1

## 2023-08-08 MED ORDER — LACTATED RINGERS IV BOLUS
1000.0000 mL | INJECTION | Freq: Once | Status: DC
Start: 2023-08-08 — End: 2023-08-08

## 2023-08-08 MED ORDER — OXYTOCIN 10 UNIT/ML INJECTION SOLUTION
1.0000 m[IU]/min | INTRAVENOUS | Status: DC
Start: 2023-08-08 — End: 2023-08-08
  Administered 2023-08-08: 0.003 [IU]/min via INTRAVENOUS
  Administered 2023-08-08: 0.011 [IU]/min via INTRAVENOUS
  Administered 2023-08-08: 9 m[IU]/min via INTRAVENOUS
  Administered 2023-08-08: 50 m[IU]/min via INTRAVENOUS
  Administered 2023-08-08: 13 m[IU]/min via INTRAVENOUS
  Administered 2023-08-08: 0.005 [IU]/min via INTRAVENOUS
  Administered 2023-08-08: 1 m[IU]/min via INTRAVENOUS
  Administered 2023-08-08: 0.007 [IU]/min via INTRAVENOUS
  Filled 2023-08-08: qty 3

## 2023-08-08 MED ORDER — MISOPROSTOL 25 MCG QUARTER TABLET
25.0000 ug | ORAL_TABLET | ORAL | Status: DC
Start: 2023-08-08 — End: 2023-08-08

## 2023-08-08 MED ORDER — GLYCERIN-WITCH HAZEL 12.5 %-50 % TOPICAL PADS
MEDICATED_PAD | Freq: Every day | CUTANEOUS | Status: DC
Start: 2023-08-08 — End: 2023-08-10
  Filled 2023-08-08: qty 40

## 2023-08-08 MED ORDER — MINERAL OIL ORAL
60.0000 mL | TOPICAL_OIL | Freq: Once | ORAL | Status: DC | PRN
Start: 2023-08-08 — End: 2023-08-08
  Filled 2023-08-08: qty 60

## 2023-08-08 MED ORDER — SODIUM CHLORIDE 0.9 % INTRAVENOUS SOLUTION
INTRAVENOUS | Status: DC
Start: 2023-08-08 — End: 2023-08-08
  Administered 2023-08-08: 10 mL/h via EPIDURAL

## 2023-08-08 MED ORDER — BISACODYL 10 MG RECTAL SUPPOSITORY
10.0000 mg | Freq: Once | RECTAL | Status: AC | PRN
Start: 2023-08-08 — End: 2023-08-08

## 2023-08-08 MED ORDER — CARBOPROST TROMETHAMINE 250 MCG/ML INTRAMUSCULAR SOLUTION
250.0000 ug | Freq: Once | INTRAMUSCULAR | Status: DC | PRN
Start: 2023-08-08 — End: 2023-08-10

## 2023-08-08 MED ORDER — MISOPROSTOL 100 MCG TABLET
1000.0000 ug | ORAL_TABLET | Freq: Once | ORAL | Status: DC | PRN
Start: 2023-08-08 — End: 2023-08-10

## 2023-08-08 MED ORDER — SODIUM CHLORIDE 0.9 % (FLUSH) INJECTION SYRINGE
3.0000 mL | INJECTION | INTRAMUSCULAR | Status: DC | PRN
Start: 2023-08-08 — End: 2023-08-08

## 2023-08-08 MED ORDER — LACTATED RINGERS INTRAVENOUS SOLUTION
INTRAVENOUS | Status: DC
Start: 2023-08-08 — End: 2023-08-08

## 2023-08-08 MED ORDER — DOCUSATE SODIUM 100 MG CAPSULE
100.0000 mg | ORAL_CAPSULE | Freq: Two times a day (BID) | ORAL | Status: DC | PRN
Start: 2023-08-08 — End: 2023-08-10
  Administered 2023-08-10: 100 mg via ORAL
  Filled 2023-08-08: qty 1

## 2023-08-08 MED ORDER — BENZOCAINE 20 %-MENTHOL 0.5 % TOPICAL AEROSOL
1.0000 | INHALATION_SPRAY | Freq: Once | CUTANEOUS | Status: AC
Start: 2023-08-08 — End: 2023-08-08
  Administered 2023-08-08: 1 via TOPICAL
  Filled 2023-08-08: qty 85

## 2023-08-08 MED ORDER — SODIUM CHLORIDE 0.9 % (FLUSH) INJECTION SYRINGE
3.0000 mL | INJECTION | INTRAMUSCULAR | Status: DC | PRN
Start: 2023-08-08 — End: 2023-08-09
  Filled 2023-08-08: qty 10

## 2023-08-08 MED ORDER — MODIFIED LANOLIN 100 % TOPICAL CREAM
TOPICAL_CREAM | CUTANEOUS | Status: DC | PRN
Start: 2023-08-08 — End: 2023-08-10

## 2023-08-08 MED ORDER — BENZOCAINE 20 %-MENTHOL 0.5 % TOPICAL AEROSOL
1.0000 | INHALATION_SPRAY | Freq: Once | CUTANEOUS | Status: DC
Start: 2023-08-08 — End: 2023-08-08

## 2023-08-08 MED ORDER — METHYLERGONOVINE 0.2 MG/ML (1 ML) INJECTION SOLUTION
0.2000 mg | Freq: Once | INTRAMUSCULAR | Status: DC | PRN
Start: 2023-08-08 — End: 2023-08-09

## 2023-08-08 MED ORDER — BENZOCAINE 20 %-MENTHOL 0.5 % TOPICAL AEROSOL
1.0000 | INHALATION_SPRAY | Freq: Four times a day (QID) | CUTANEOUS | Status: DC | PRN
Start: 2023-08-08 — End: 2023-08-10
  Administered 2023-08-08: 1 via TOPICAL

## 2023-08-08 MED ORDER — PRENATAL VIT-IRON-FOLATE TAB WRAPPER
1.0000 | ORAL_TABLET | Freq: Every day | Status: DC
Start: 2023-08-08 — End: 2023-08-10
  Administered 2023-08-09 – 2023-08-10 (×2): 1 via ORAL
  Filled 2023-08-08 (×2): qty 1

## 2023-08-08 MED ORDER — GLYCERIN-WITCH HAZEL 12.5 %-50 % TOPICAL PADS
MEDICATED_PAD | CUTANEOUS | Status: DC | PRN
Start: 2023-08-08 — End: 2023-08-10

## 2023-08-08 MED ORDER — OXYTOCIN 10 UNIT/ML INJECTION SOLUTION
INTRAVENOUS | Status: AC
Start: 2023-08-08 — End: 2023-08-09

## 2023-08-08 MED ORDER — LACTATED RINGERS IV BOLUS
500.0000 mL | INJECTION | Status: DC | PRN
Start: 2023-08-08 — End: 2023-08-09
  Administered 2023-08-08: 500 mL via INTRAVENOUS

## 2023-08-08 MED ORDER — METHYLERGONOVINE 0.2 MG/ML (1 ML) INJECTION SOLUTION
0.2000 mg | Freq: Once | INTRAMUSCULAR | Status: DC | PRN
Start: 2023-08-08 — End: 2023-08-10

## 2023-08-08 NOTE — Care Plan (Signed)
Problem: Adult Inpatient Plan of Care  Goal: Plan of Care Review  Outcome: Ongoing (see interventions/notes)  Goal: Patient-Specific Goal (Individualized)  Outcome: Ongoing (see interventions/notes)  Goal: Absence of Hospital-Acquired Illness or Injury  Outcome: Ongoing (see interventions/notes)  Intervention: Prevent Skin Injury  Recent Flowsheet Documentation  Taken 08/08/2023 1940 by Elijah Birk  Skin Protection: adhesive use limited  Intervention: Prevent and Manage VTE (Venous Thromboembolism) Risk  Recent Flowsheet Documentation  Taken 08/08/2023 2125 by Azaan Leask  VTE Prevention/Management: ambulation promoted  Taken 08/08/2023 2025 by Zaira Iacovelli  VTE Prevention/Management: ambulation promoted  Taken 08/08/2023 1940 by Levaughn Puccinelli  VTE Prevention/Management: ambulation promoted  Goal: Optimal Comfort and Wellbeing  Outcome: Ongoing (see interventions/notes)  Goal: Rounds/Family Conference  Outcome: Ongoing (see interventions/notes)     Problem: Postpartum (Vaginal Delivery)  Goal: Successful Parent Role Transition  Outcome: Ongoing (see interventions/notes)  Goal: Hemostasis  Outcome: Ongoing (see interventions/notes)  Goal: Absence of Infection Signs and Symptoms  Outcome: Ongoing (see interventions/notes)  Goal: Anesthesia/Sedation Recovery  Outcome: Ongoing (see interventions/notes)  Goal: Optimal Pain Control and Function  Outcome: Ongoing (see interventions/notes)  Intervention: Prevent or Manage Pain  Recent Flowsheet Documentation  Taken 08/08/2023 1940 by [facility] Mistie Adney  Perineal Care:   absorbent pad changed   perineum cleansed  Goal: Effective Urinary Elimination  Outcome: Ongoing (see interventions/notes)

## 2023-08-08 NOTE — Care Plan (Signed)
Problem: Adult Inpatient Plan of Care  Goal: Plan of Care Review  Outcome: Ongoing (see interventions/notes)  Goal: Patient-Specific Goal (Individualized)  Outcome: Ongoing (see interventions/notes)  Flowsheets (Taken 08/08/2023 0220)  Individualized Care Needs: n/a  Anxieties, Fears or Concerns: n/a  Goal: Absence of Hospital-Acquired Illness or Injury  Outcome: Ongoing (see interventions/notes)  Goal: Optimal Comfort and Wellbeing  Outcome: Ongoing (see interventions/notes)  Goal: Rounds/Family Conference  Outcome: Ongoing (see interventions/notes)

## 2023-08-08 NOTE — Anesthesia Procedure Notes (Signed)
Rozann Lesches Blahnik    Lumbar Epidural   Performed by:    Performing Provider:  Nydia Bouton, DO  Authorizing Provider:  Nydia Bouton, DO      Indication: pain relief in labor and delivery        Pt location: At bedside  Technique: ( See MAR for exact doses)  Technique/Approach: midline        Needle Level: L3-4  Sterile Skin Prep : sterile drape, sterilely prepped and draped, sterile gloves and mask Preprocedure hand washing was performed sterile field maintained     Pre anesthesia checklist: H&P updated and consent obtained, Patient positioned, Patient monitors applied, Timeout performed:, Site verified, Emergency drugs and equipment available and anesthesia consent given   Patient position: sitting   Skin local: Lidocaine 1%   Needle/Catheter: Needle type: Tuohy   Needle Gauge: 17 G and 17  Needle length: 3.5 in  Epidural Injection Technique LOR saline  Needle insertion depth 5 cm    Catheter at skin depth: 10 cm  Epidural catheter location: lumbar (1-5)  Number of attempts: 1  Events: neg aspiration,         Dosing:      Test Dose: 3 mL,  lidocaine 1.5% with epinephrine 1:200,000       Catheter: secured and sterile dressing applied

## 2023-08-08 NOTE — H&P (Signed)
[facility]  OB-GYN   History and Physical Note      PCP:  No Pcp  Referring Physician:  Kela Millin, DO    Subjective:      Erica, Morrison is a 32 y.o. [phone] female.  Encounter Start Date:  08/08/2023  Inpatient Admission Date:  08/08/2023  Date of Service: 08/08/2023    Chief Complaint:  IOL     Lab Results   Component Value Date    ABORHD O POSITIVE 08/08/2023    HGB 8.8 (L) 08/08/2023    HCT 27.7 (L) 08/08/2023       HPI:  32yo G6P5 admitted for induction of labor for fetal growth restriction with elevated UAD.     OB History   Gravida Para Term Preterm AB Living   6 5 5     5    SAB IAB Ectopic Multiple Live Births                  # Outcome Date GA Lbr Len/2nd Weight Sex Type Anes PTL Lv   6 Current            5 Term            4 Term            3 Term            2 Term            1 Term              History reviewed. No pertinent past medical history.      Past Surgical History:   Procedure Laterality Date    HX VAGINAL WALL CYST ASPIRATION           Medications Prior to Admission       Prescriptions    gabapentin (NEURONTIN) 300 mg Oral Capsule    Take 1 Capsule (300 mg total) by mouth    Patient not taking:  Reported on 08/08/2023    ondansetron (ZOFRAN ODT) 4 mg Oral Tablet, Rapid Dissolve    Take 1 Tablet (4 mg total) by mouth Every 8 hours as needed for Nausea/Vomiting          benzocaine-menthol (DERMOPLAST) 20% topical spray, 1 Spray, Topical, Once  butorphanol (STADOL) 2 mg/mL injection, 0.5 mg, Intravenous, Once  lidocaine 1% injection, 10 mL, Intradermal, Once PRN  LR premix infusion, , Intravenous, Continuous  mineral oil topical liquid, 60 mL, Topical, Once PRN  NS flush syringe, 3 mL, Intracatheter, Q8HRS  NS flush syringe, 3 mL, Intracatheter, Q1H PRN  ondansetron (ZOFRAN) 2 mg/mL injection, 4 mg, Intravenous, Q8H PRN      Allergies   Allergen Reactions    Clindamycin Anaphylaxis    Keflex [Cephalexin] Anaphylaxis    Metronidazole Anaphylaxis     Social History     Tobacco  Use    Smoking status: Former     Current packs/day: 0.00     Types: Cigarettes     Quit date: 2024     Years since quitting: 0.9    Smokeless tobacco: Never   Substance Use Topics    Alcohol use: Never    Drug use: Never     Social History     Substance and Sexual Activity   Drug Use Never         Height: 149.9 cm (4\' 11" )     Weight: 58.1 kg (128 lb)  BMI (Calculated): 25.91  Temperature: 36.7 C (98 F)  Heart Rate: 84  BP (Non-Invasive): 121/71    Patient's last menstrual period was 11/21/2022.   Estimated Date of Delivery: 12/28/[redacted]     Weeks gestation:  [redacted]w[redacted]d    Other than ROS in the HPI, all other systems were negative.    Labs:  I have reviewed all lab results.     Objective:     Respiratory:  Clear to auscultation bilaterally.   Cardiovascular:  regular rate and rhythm  Gastrointestinal:  Soft, non-tender      Bishop Score:    Per nursing documentation on admission     Bishop Scoring System   0 1 2 3    Dilatation 0 1 - 2 3 - 4 5 or more   Effacement 0 - 30 40 - 50 60 - 70 80 or more   Station -3 -2 -1,0 +1,+2   Position Posterior Mid Anterior    Consistency Firm Medium Soft        Presentation:  Cephalic    Estimated fetal weight:  <3%ile     Pelvimetry:  average    Fetal heart rate tracing:  moderate variability     Assessment:     Active Hospital Problems    Diagnosis    Primary Problem: Pregnancy affected by fetal growth restriction       Plan:     FGR   - EFW <3% with ^UAD on 12/5  - Admit for induction of labor   - GBS neg   - S/p cytotec, pitocin ordered   - AROM'ed this AM for scant amount of clear fluid     Kela Millin, DO

## 2023-08-08 NOTE — Anesthesia Preprocedure Evaluation (Addendum)
ANESTHESIA PRE-OP EVALUATION  Planned Procedure: ANES - LABOR ANALGESIA  Review of Systems     anesthesia history negative               Pulmonary  negative pulmonary ROS,    Cardiovascular  negative cardio ROS,          GI/Hepatic/Renal   negative GI/hepatic/renal ROS,         Endo/Other         Neuro/Psych/MS   negative neuro/psych ROS,      Cancer                        Physical Assessment      Airway       Mallampati: II                  Dental                    Pulmonary           Cardiovascular             Other findings              Plan  ASA 1     Planned anesthesia type: epidural                             Anesthesia issues/risks discussed are: Local Anesthetic Systemic Toxicity, Failure of Block, Spinal Headache, High Neuraxial Block and Nerve Injuries.  Anesthetic plan and risks discussed with patient  signed consent obtained          Patient's NPO status is appropriate for Anesthesia.

## 2023-08-08 NOTE — Care Plan (Signed)
 Problem: Adult Inpatient Plan of Care  Goal: Plan of Care Review  Outcome: Ongoing (see interventions/notes)  Goal: Patient-Specific Goal (Individualized)  Outcome: Ongoing (see interventions/notes)  Goal: Absence of Hospital-Acquired Illness or Injury  Outcome: Ongoing (see interventions/notes)  Goal: Optimal Comfort and Wellbeing  Outcome: Ongoing (see interventions/notes)  Goal: Rounds/Family Conference  Outcome: Ongoing (see interventions/notes)     Problem: Labor  Goal: Hemostasis  Outcome: Outcome Achieved  Goal: Stable Fetal Wellbeing  Outcome: Outcome Achieved  Goal: Effective Progression to Delivery  Outcome: Outcome Achieved  Goal: Absence of Infection Signs and Symptoms  Outcome: Outcome Achieved  Goal: Acceptable Pain Control  Outcome: Outcome Achieved  Goal: Normal Uterine Contraction Pattern  Outcome: Outcome Achieved     Problem: Postpartum (Vaginal Delivery)  Goal: Successful Parent Role Transition  Outcome: Ongoing (see interventions/notes)  Goal: Hemostasis  Outcome: Ongoing (see interventions/notes)  Goal: Absence of Infection Signs and Symptoms  Outcome: Ongoing (see interventions/notes)  Goal: Anesthesia/Sedation Recovery  Outcome: Ongoing (see interventions/notes)  Goal: Optimal Pain Control and Function  Outcome: Ongoing (see interventions/notes)  Goal: Effective Urinary Elimination  Outcome: Ongoing (see interventions/notes)

## 2023-08-09 NOTE — Care Plan (Signed)
Problem: Adult Inpatient Plan of Care  Goal: Plan of Care Review  Outcome: Ongoing (see interventions/notes)  Goal: Patient-Specific Goal (Individualized)  Outcome: Ongoing (see interventions/notes)  Goal: Absence of Hospital-Acquired Illness or Injury  Outcome: Ongoing (see interventions/notes)  Intervention: Prevent Skin Injury  Recent Flowsheet Documentation  Taken 08/09/2023 2109 by Kabeer Hoagland  Skin Protection: adhesive use limited  Goal: Optimal Comfort and Wellbeing  Outcome: Ongoing (see interventions/notes)  Goal: Rounds/Family Conference  Outcome: Ongoing (see interventions/notes)     Problem: Postpartum (Vaginal Delivery)  Goal: Successful Parent Role Transition  Outcome: Ongoing (see interventions/notes)  Goal: Hemostasis  Outcome: Ongoing (see interventions/notes)  Goal: Absence of Infection Signs and Symptoms  Outcome: Ongoing (see interventions/notes)  Goal: Anesthesia/Sedation Recovery  Outcome: Ongoing (see interventions/notes)  Goal: Optimal Pain Control and Function  Outcome: Ongoing (see interventions/notes)  Goal: Effective Urinary Elimination  Outcome: Ongoing (see interventions/notes)

## 2023-08-09 NOTE — Care Management Notes (Signed)
[facility]  Care Management Note    Patient Name: Erica Morrison  Date of Birth: 10/22/1990  Sex: female  Date/Time of Admission: 08/08/2023  1:56 AM  Room/Bed: 08/A  Payor: [employer] - [place] / Plan: [employer] BETTER [employer] - [place] / Product Type: Medicaid [place] /    LOS: 1 day   Primary Care Providers:  Pcp, No (General)    Admitting Diagnosis:  Pregnancy affected by fetal growth restriction [O36.5990]    Assessment:       Discharge Plan:  Home with Child Protective Services Involvement (code 1)  TRIGGER: FENTANYL POSITIVE ON ADMISSION, THC IN OFFICE DURING PREGNANCY. CM SPOKE WITH KELLIE, CPS HOTLINE. # [id number].     The patient will continue to be evaluated for developing discharge needs.     Case Manager: Luiz Ochoa, SOCIAL WORKER  Phone: [phone]

## 2023-08-09 NOTE — Progress Notes (Signed)
Post-Partum Progress Note     S: PPD#1  Patient is doing well, no pain. No fever, chills. Denies CP, SOB  Baby is bottle feeding  Pain control is adequate. No excessive PP bleeding, no abnormal discharge  Pateint is tolerating PO    Ambulating. Normal BM, micturation      0:  Filed Vitals:    08/08/23 2025 08/08/23 2125 08/08/23 2225 08/09/23 0758   BP:    118/64   Pulse:    85   Resp: 18 16 18 20    Temp:    36.7 C (98 F)   SpO2:               General: AOx3, NAD  Lungs: No respiratory distress   ABD: UFFBU, NTTP, soft, +BS  EXT: no edema, non-tender to palpation    A/P:  PPD#1 s/p SVD  - doing well  - lochia light   - urine drug screen +fentanyl on admission, discussed with patient and she denies use. Attempted to send urine for confirmation but lab stated they didn't have enough. Would not recommend recollecting now due to having an epidural with fentanyl in it   - urine drug screen only +THC in the office      Kela Millin, DO

## 2023-08-09 NOTE — Anesthesia Postprocedure Evaluation (Signed)
Anesthesia Post Op Evaluation    Patient: Erica Morrison  ANES - LABOR ANALGESIA    Last Vitals:Temperature: 36.7 C (98.1 F) (08/08/23 1940)  Heart Rate: 85 (08/09/23 0758)  BP (Non-Invasive): 118/64 (08/09/23 0758)  Respiratory Rate: 18 (08/08/23 2225)  SpO2: 99 % (08/08/23 1615)    No notable events documented.    Patient is sufficiently recovered from the effects of anesthesia to participate in the evaluation and has returned to their pre-procedure level.  Patient location during evaluation: bedside       Patient participation: complete - patient participated  Level of consciousness: awake and alert    Pain management: satisfactory to patient  Airway patency: patent    Anesthetic complications: no  Cardiovascular status: hemodynamically stable and acceptable  Respiratory status: acceptable and room air  Hydration status: acceptable  Patient post-procedure temperature: Pt Normothermic   PONV Status: Absent  Comments: Patient denies headache, LE paresthesia or weakness, urinary retention, and significant back pain.

## 2023-08-10 LAB — SYPHILIS SCREENING ALGORITHM WITH REFLEX, SERUM: SYPHILIS TP ANTIBODIES: NONREACTIVE

## 2023-08-10 MED ORDER — IBUPROFEN 800 MG TABLET
800.0000 mg | ORAL_TABLET | Freq: Three times a day (TID) | ORAL | 1 refills | Status: AC | PRN
Start: 2023-08-10 — End: ?

## 2023-08-10 NOTE — Nurses Notes (Signed)

## 2023-08-10 NOTE — Discharge Summary (Signed)
[facility]  DISCHARGE SUMMARY - OBSTETRICS      PATIENT NAME:  Erica Morrison, Erica Morrison  MRN:  [id number]  DOB:  1991-03-02    ENCOUNTER START DATE:  08/08/2023  INPATIENT ADMISSION DATE: 08/08/2023  DISCHARGE DATE:  08/10/2023    ATTENDING PHYSICIAN: Elizabeth Sauer T, DO  PRIMARY CARE PHYSICIAN: No Pcp     ADMISSION DIAGNOSIS: Induction of labor    WEEKS GESTATION ON ADMISSION:: [redacted]w[redacted]d    DISCHARGE DIAGNOSIS: Term pregnancy - delivered    DISCHARGE MEDICATIONS:     Current Discharge Medication List        START taking these medications.        Details   Ibuprofen 800 mg Tablet  Commonly known as: MOTRIN   800 mg, Oral, 3 TIMES DAILY PRN  Qty: 90 Tablet  Refills: 1            CONTINUE these medications - NO CHANGES were made during your visit.        Details   ondansetron 4 mg Tablet, Rapid Dissolve  Commonly known as: ZOFRAN ODT   4 mg, Oral, EVERY 8 HOURS PRN  Qty: 12 Tablet  Refills: 0            ASK your doctor about these medications.        Details   gabapentin 300 mg Capsule  Commonly known as: NEURONTIN   300 mg  Refills: 0              DISCHARGE INSTRUCTIONS:      DISCHARGE INSTRUCTION - ACTIVITY    LIGHT ACTIVITY, NO LIFTING, PELVIC REST, NO BATHS, NO SWIMMING, NO DRIVING FOR 2 WEEKS; PELVIC REST--NOTHING VAGINALLY UNTIL CLEARED BY THE PHYSICIAN     Activity: AS TOLERATED      DISCHARGE INSTRUCTION - DIET - RESUME HOME DIET     Diet: RESUME HOME DIET      ACTIVITY    STAIRS: Take stairs slowly, and with assistance if needed, for the first two weeks.  TUB BATH: No tub baths or pools for 3 - 4 weeks.  May shower daily.  DRIVING: No driving while taking narcotic pain medication.  WALKS: As tolerated  HOUSEWORK:  Light tasks and self-care only.  LIFTING:  No lifting greater than 10 - 15 lbs for the first 3 months.  After 3 months, it is recommended not to lift greater than 50 lbs lifelong.   NORMAL ACTIVITY/EXERCISE: May resume normal activities and light aerobic exercise 2 weeks after surgery.      Activity: AS INSTRUCTED      DISCHARGE MED INSTRUCTIONS    PAIN:   If able to use NSAIDs, use Naprosyn (Aleve) or Ibuprofen (Motrin or Advil) 600-800mg  every six to eight hrs for 1 to 2 weeks after surgery.  Manage your remaining pain with prescribed narcotic as needed.  Pain should gradually improve.  If pain worsens, call the office.    BOWEL CARE:  Take stool softener (Colace) 2-3 times/day and over the counter fiber (Metamucil, Citrucel) daily.   If no bowel movement for 2 days, stimulate with over the counter laxative (Miralax, Exlax, etc) or Dulcolax suppository.  This may be repeated as needed to maintain bowel function.   Call the office if no results.     DISCHARGE INSTRUCTIONS    Call office immediately with fever higher than 101, worsening pain, repeated nausea/vomiting, or heavy vaginal bleeding.     DISCHARGE INSTRUCTION - ADDITIONAL INFO  Vaginal discharge after surgery is normal.  You may have vaginal itching, vaginal odor and colored vaginal discharge up to 8 weeks after surgery.  Light vaginal bleeding is normal for up to 8 weeks.     Intercourse or cream in the vagina:  Must wait for approval from physician (usually 6 weeks).      Follow-up Information       Elizabeth Sauer T, DO Follow up in 6 week(s).    Specialty: OBSTETRICS AND GYNECOLOGY-GYNECOLOGY  Contact information:  [address]  [place] [address]  [phone]                             SIGNIFICANT LAB:     Lab Results   Component Value Date    WBC 15.9 (H) 08/08/2023    HGB 8.8 (L) 08/08/2023    HCT 27.7 (L) 08/08/2023    PLTCNT 174 08/08/2023         Procedure:  spontaneous vaginal     Anesthesia:  epidural    Postpartum Complications:  clitoral laceration    Edinburgh PP Depression score upon admission:       Erica Morrison, Erica Morrison [[id number]]      Delivery Information    Birth date/time: 08/[birth year]/2023 1613  Sex: Female  Delivery type: Vaginal, Spontaneous  Complications: None       Newborn Measurements    Weight: 2105 g  Length: 45.7  cm  Head circumference: 31.1 cm  Chest circumference: 29.2 cm       Newborn  Apgars    Living status: Living      Skin color:    Heart rate:    Reflex Irrit:    Muscle tone:    Resp. effort:    Total:     1 Min:    0    2    2    2    2    8     5  Min:    1    2    2    2    2    9     10  Min:     15 Min:     20 Min:       Apgars assigned by: Peggye Ley RN / Keturah Barre RN              Feeding Method:  bottle    COURSE IN HOSPITAL: Anaika is a 32yo female who presented for IOL for fetal growth restriction with dopplers >95th percentile. She had misoprostol, AROM clear fluid. Epidural placed. SVD with periclitoral laceration.     CONDITION ON DISCHARGE: Alert, Oriented, and VS Stable    DISCHARGE DISPOSITION:  Home discharge     Budd Palmer, DO 08/10/2023 09:37

## 2023-08-10 NOTE — Discharge Instructions (Signed)
CALL THE PROVIDER IF:  SATURATING MORE THAN ONE PAD PER HOUR OR PASSING SEVERAL BLOOD CLOTS, THE SIZE OF AN EGG   HEADACHE WITH VISION CHANGES  TEMPERATURE ABOVE 100.4F  SIGNS OF A BLOOD CLOT IN ONE OR BOTH LEGS: WARM TO THE TOUCH, REDNESS, OR BUMPS    CALL 911 FOR FOLLOWING:  SHORTNESS OF BREATH   SEIZURES  THOUGHTS OF HARMING YOURSELF OR OTHERS    CALL [facility] FOR ANY QUESTIONS OR CONCERNS AT [phone]

## 2023-08-10 NOTE — Care Plan (Signed)
 Problem: Adult Inpatient Plan of Care  Goal: Plan of Care Review  Outcome: Ongoing (see interventions/notes)  Goal: Patient-Specific Goal (Individualized)  Outcome: Ongoing (see interventions/notes)  Goal: Absence of Hospital-Acquired Illness or Injury  Outcome: Ongoing (see interventions/notes)  Goal: Optimal Comfort and Wellbeing  Outcome: Ongoing (see interventions/notes)  Goal: Rounds/Family Conference  Outcome: Ongoing (see interventions/notes)     Problem: Postpartum (Vaginal Delivery)  Goal: Successful Parent Role Transition  Outcome: Ongoing (see interventions/notes)  Goal: Hemostasis  Outcome: Ongoing (see interventions/notes)  Goal: Absence of Infection Signs and Symptoms  Outcome: Ongoing (see interventions/notes)  Goal: Anesthesia/Sedation Recovery  Outcome: Ongoing (see interventions/notes)  Goal: Optimal Pain Control and Function  Outcome: Ongoing (see interventions/notes)  Goal: Effective Urinary Elimination  Outcome: Ongoing (see interventions/notes)

## 2023-08-13 ENCOUNTER — Encounter (HOSPITAL_COMMUNITY): Payer: Self-pay | Admitting: Student in an Organized Health Care Education/Training Program

## 2023-08-18 LAB — DRUG MONITORING, FENTANYL, QUANTITATIVE, URINE: NORFENTANYL: 2.8 ng/mL — ABNORMAL HIGH (ref <0.50)

## 2023-08-18 LAB — NOTES AND COMMENTS
